# Patient Record
Sex: Female | Born: 1980 | Race: White | Hispanic: No | Marital: Single | Smoking: Never smoker
Health system: Southern US, Community
[De-identification: ages and names within clinical notes are randomized; demographics above are authoritative.]

## PROBLEM LIST (undated history)

## (undated) DIAGNOSIS — I8390 Asymptomatic varicose veins of unspecified lower extremity: Secondary | ICD-10-CM

## (undated) HISTORY — DX: Asymptomatic varicose veins of unspecified lower extremity: I83.90

---

## 2005-01-15 ENCOUNTER — Other Ambulatory Visit: Admission: RE | Admit: 2005-01-15 | Discharge: 2005-01-15 | Payer: Self-pay | Admitting: Obstetrics and Gynecology

## 2010-02-02 ENCOUNTER — Ambulatory Visit (HOSPITAL_COMMUNITY): Admission: RE | Admit: 2010-02-02 | Discharge: 2010-02-02 | Payer: Self-pay | Admitting: Obstetrics and Gynecology

## 2010-06-22 ENCOUNTER — Inpatient Hospital Stay (HOSPITAL_COMMUNITY)
Admission: RE | Admit: 2010-06-22 | Discharge: 2010-06-25 | DRG: 372 | Disposition: A | Discharge status: Home / Self Care | Payer: BC Managed Care – PPO | Source: Ambulatory Visit | Attending: Obstetrics and Gynecology | Admitting: Obstetrics and Gynecology

## 2010-06-22 DIAGNOSIS — O139 Gestational [pregnancy-induced] hypertension without significant proteinuria, unspecified trimester: Principal | ICD-10-CM | POA: Diagnosis present

## 2010-06-22 DIAGNOSIS — Z22330 Carrier of Group B streptococcus: Secondary | ICD-10-CM

## 2010-06-22 DIAGNOSIS — O99892 Other specified diseases and conditions complicating childbirth: Secondary | ICD-10-CM | POA: Diagnosis present

## 2010-06-22 LAB — CBC
HCT: 37.2 % (ref 36.0–46.0)
MCH: 29.2 pg (ref 26.0–34.0)
MCHC: 33.3 g/dL (ref 30.0–36.0)
MCV: 87.5 fL (ref 78.0–100.0)
Platelets: 194 10*3/uL (ref 150–400)
RDW: 13.1 % (ref 11.5–15.5)
WBC: 7.1 10*3/uL (ref 4.0–10.5)

## 2010-06-24 LAB — CBC
MCH: 29 pg (ref 26.0–34.0)
MCHC: 32.8 g/dL (ref 30.0–36.0)
MCV: 88.6 fL (ref 78.0–100.0)
Platelets: 161 10*3/uL (ref 150–400)
RDW: 13.2 % (ref 11.5–15.5)

## 2011-12-20 IMAGING — US US OB DETAIL+14 WK
2 series · 14 of 28 positions shown · non-contrast
Comparison: none

OBSTETRICAL ULTRASOUND:
 This ultrasound exam was performed in the [HOSPITAL] Ultrasound Department.  The OB US report was generated in the AS system, and faxed to the ordering physician.  This report is also available in [HOSPITAL]?s AccessANYware and in [REDACTED] PACS.

[Series 1: us ob detail +14 wk · 0.27mm/px · 12 of 54 slices shown (1 of 2)]
[im 3/54]
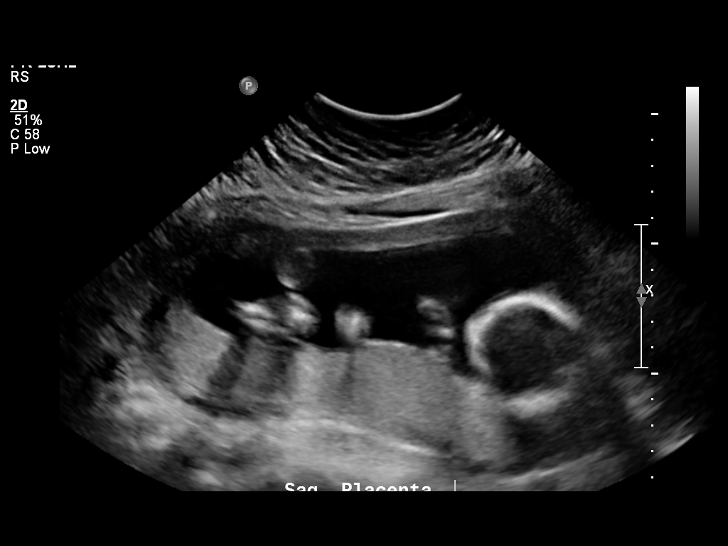
[im 7/54]
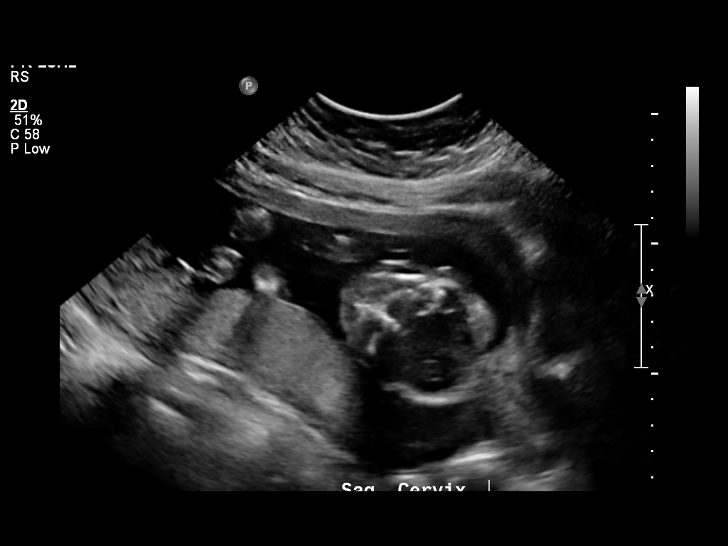
[im 12/54]
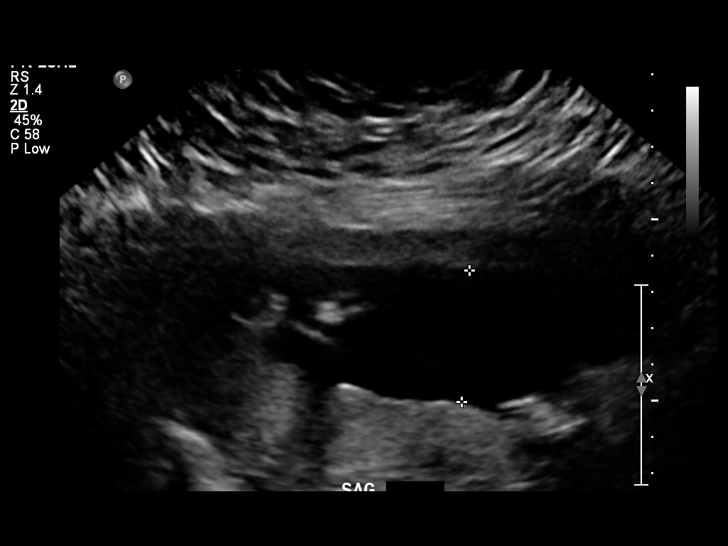
[im 17/54]
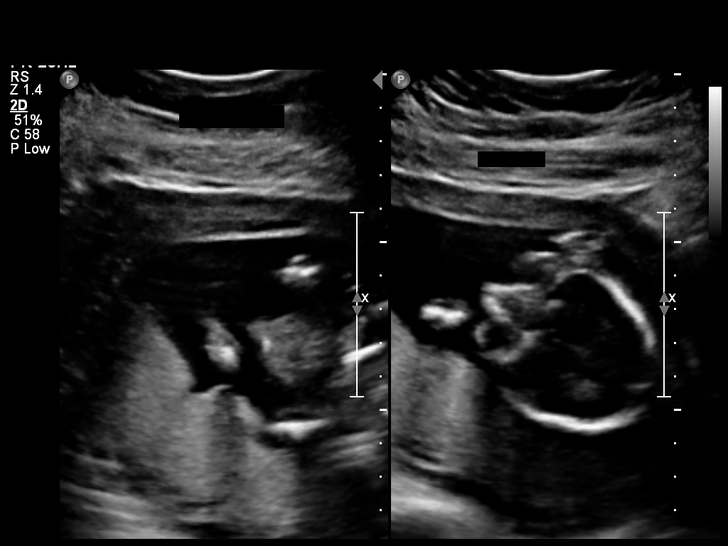
[im 21/54]
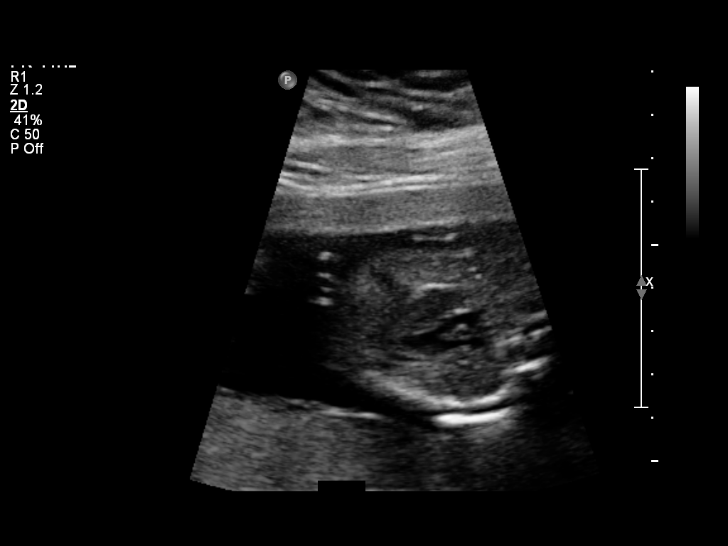
[im 26/54]
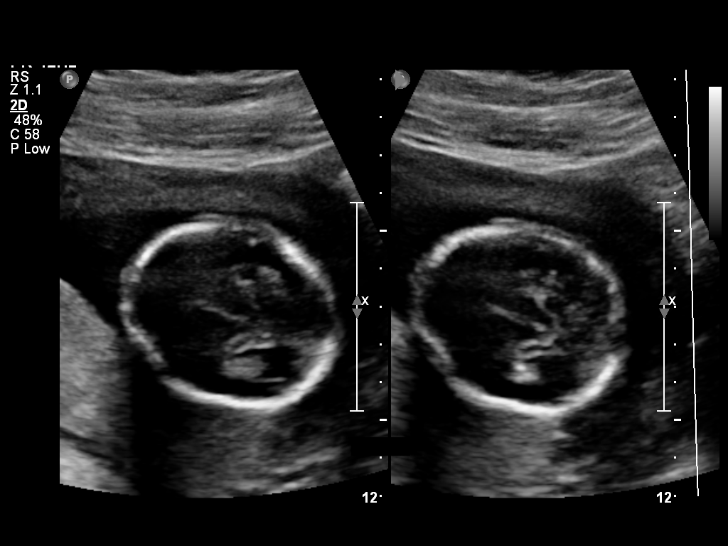
[im 30/54]
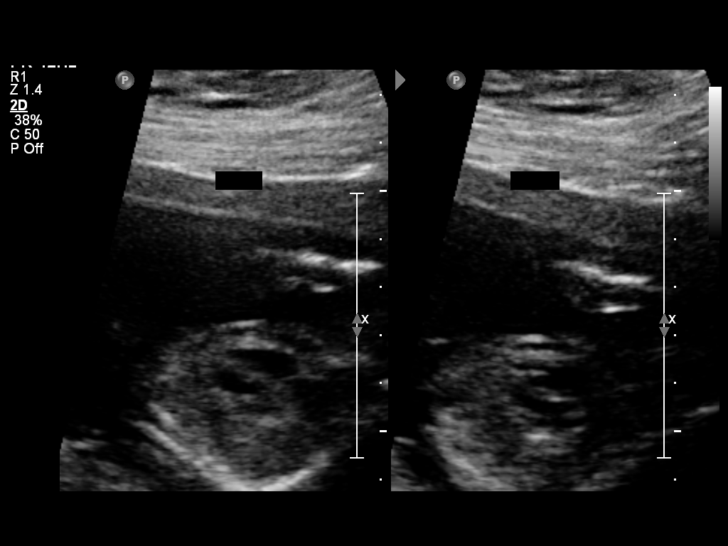
[im 35/54]
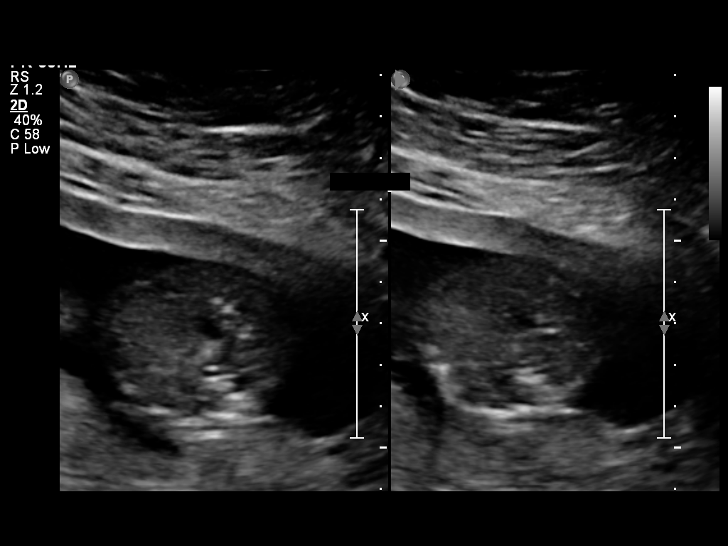
[im 40/54]
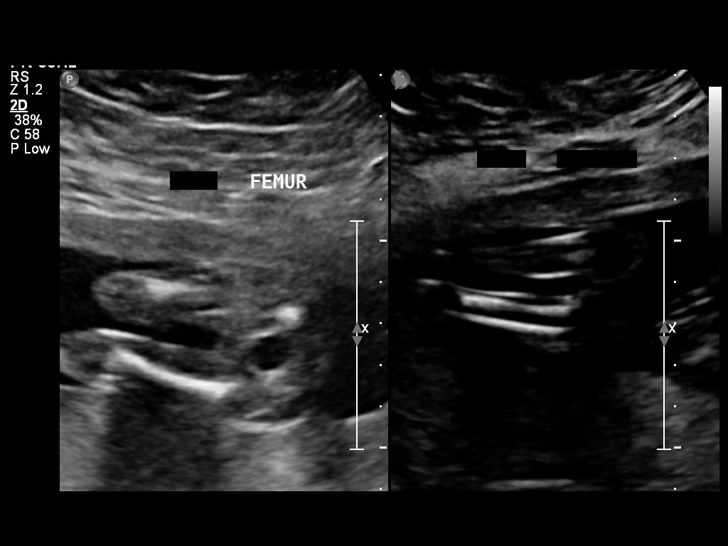
[im 44/54]
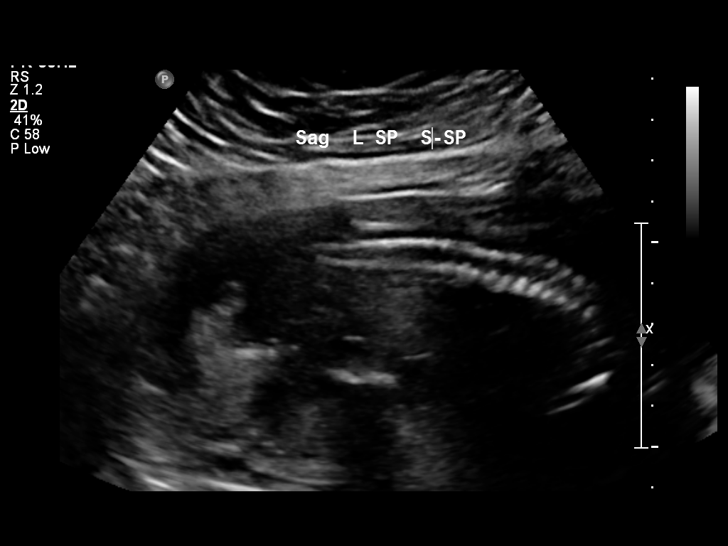
[im 49/54]
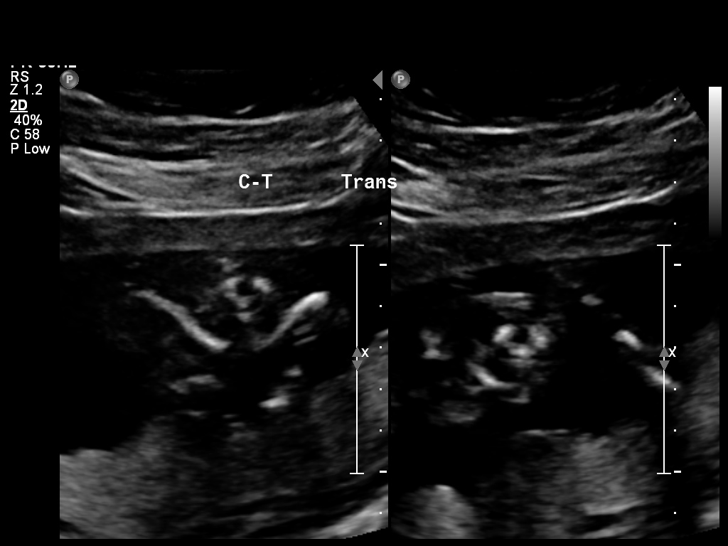
[im 54/54]
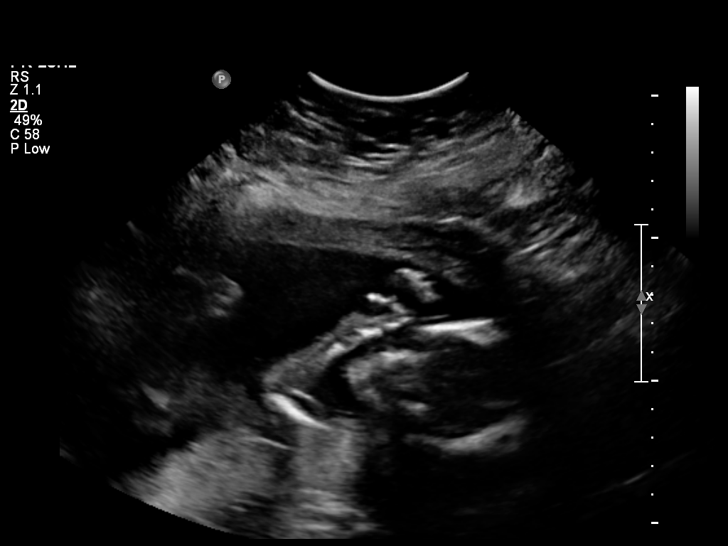

[Series 1: us ob detail +14 wk · 0.21mm/px · 2 of 9 slices shown (2 of 2)]
[im 3/9]
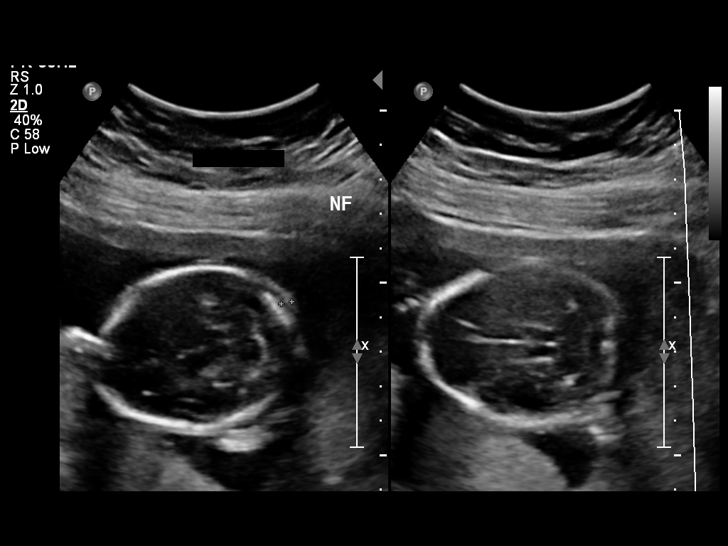
[im 9/9]
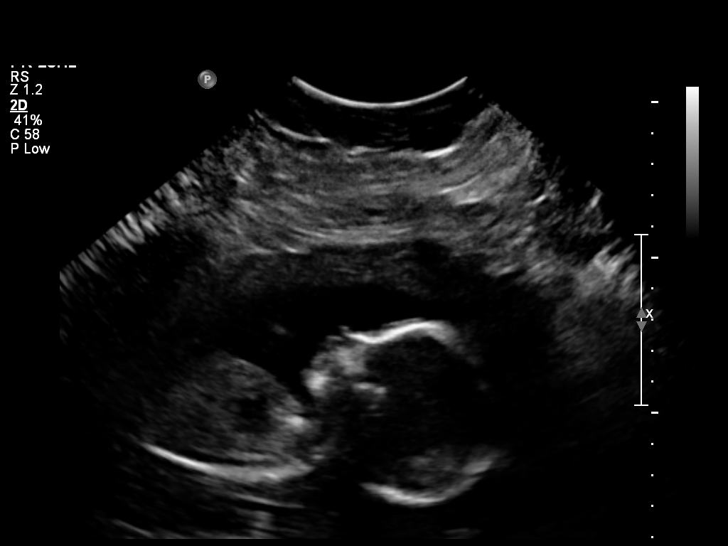

[14 of 28 positions shown; findings below may reference images not displayed]

IMPRESSION: See AS Obstetric US report.

## 2012-11-05 ENCOUNTER — Ambulatory Visit: Payer: Self-pay

## 2012-11-06 ENCOUNTER — Ambulatory Visit: Payer: Self-pay | Admitting: *Deleted

## 2012-11-06 ENCOUNTER — Ambulatory Visit (INDEPENDENT_AMBULATORY_CARE_PROVIDER_SITE_OTHER): Payer: BC Managed Care – PPO | Admitting: *Deleted

## 2012-11-06 DIAGNOSIS — Z23 Encounter for immunization: Secondary | ICD-10-CM

## 2012-12-23 ENCOUNTER — Encounter: Payer: Self-pay | Admitting: Vascular Surgery

## 2012-12-24 ENCOUNTER — Encounter: Payer: BC Managed Care – PPO | Admitting: Vascular Surgery

## 2013-01-20 ENCOUNTER — Other Ambulatory Visit: Payer: Self-pay | Admitting: *Deleted

## 2013-01-20 DIAGNOSIS — I83893 Varicose veins of bilateral lower extremities with other complications: Secondary | ICD-10-CM

## 2013-02-03 ENCOUNTER — Encounter: Payer: Self-pay | Admitting: Vascular Surgery

## 2013-02-04 ENCOUNTER — Encounter: Payer: Self-pay | Admitting: Vascular Surgery

## 2013-02-04 ENCOUNTER — Ambulatory Visit (HOSPITAL_COMMUNITY)
Admission: RE | Admit: 2013-02-04 | Discharge: 2013-02-04 | Disposition: A | Discharge status: Home / Self Care | Payer: BC Managed Care – PPO | Source: Ambulatory Visit | Attending: Vascular Surgery | Admitting: Vascular Surgery

## 2013-02-04 ENCOUNTER — Encounter (INDEPENDENT_AMBULATORY_CARE_PROVIDER_SITE_OTHER): Payer: Self-pay

## 2013-02-04 ENCOUNTER — Ambulatory Visit (INDEPENDENT_AMBULATORY_CARE_PROVIDER_SITE_OTHER): Payer: Self-pay | Admitting: Vascular Surgery

## 2013-02-04 VITALS — BP 128/76 | HR 74 | Ht 72.0 in | Wt 240.0 lb

## 2013-02-04 DIAGNOSIS — I83893 Varicose veins of bilateral lower extremities with other complications: Secondary | ICD-10-CM | POA: Insufficient documentation

## 2013-02-04 NOTE — Progress Notes (Signed)
[facility]  Patient name: Latasha Holmes MRN: [id number] DOB: Sep 01, 1980 Sex: female  REASON FOR CONSULT: varicose veins of both lower extremity  HPI: Latasha Holmes is a 32 y.o. female who began developing increasing varicose veins of both lower extremities after her pregnancy and 2012 to she experiences aching pain, heaviness, fatigue, and cramps in both lower extremities but more significantly on the right side. Her symptoms are aggravated by standing and sitting. Her symptoms are alleviated somewhat with elevation. She has tried nonmedical grade compression stockings without much relief. She is unaware of any history of DVT or phlebitis.  History reviewed. No pertinent past medical history.  Family History  Problem Relation Age of Onset  . Varicose Veins Mother    SOCIAL HISTORY: History  Substance Use Topics  . Smoking status: Never Smoker   . Smokeless tobacco: Never Used  . Alcohol Use: 1.2 oz/week    2 Glasses of wine per week   No Known Allergies No current outpatient prescriptions on file.   No current facility-administered medications for this visit.   REVIEW OF SYSTEMS: Arly.Keller ] denotes positive finding; [  ] denotes negative finding  CARDIOVASCULAR:  [ ]  chest pain   [ ]  chest pressure   [ ]  palpitations   [ ]  orthopnea   [ ]  dyspnea on exertion   [ ]  claudication   [ ]  rest pain   [ ]  DVT   [ ]  phlebitis PULMONARY:   [ ]  productive cough   [ ]  asthma   [ ]  wheezing NEUROLOGIC:   [ ]  weakness  [ ]  paresthesias  [ ]  aphasia  [ ]  amaurosis  [ ]  dizziness HEMATOLOGIC:   [ ]  bleeding problems   [ ]  clotting disorders MUSCULOSKELETAL:  [ ]  joint pain   [ ]  joint swelling [ ]  leg swelling GASTROINTESTINAL: [ ]   blood in stool  [ ]   hematemesis GENITOURINARY:  [ ]   dysuria  [ ]   hematuria PSYCHIATRIC:  [ ]  history of major depression INTEGUMENTARY:  [ ]  rashes  [ ]  ulcers CONSTITUTIONAL:  [ ]  fever   [ ]  chills  PHYSICAL EXAM: Filed  Vitals:   02/04/13 1123  BP: 128/76  Pulse: 74  Height: 6' (1.829 m)  Weight: 240 lb (108.863 kg)  SpO2: 100%   Body mass index is 32.54 kg/(m^2). GENERAL: The patient is a well-nourished female, in no acute distress. The vital signs are documented above. CARDIOVASCULAR: There is a regular rate and rhythm. I do not detect carotid bruits. She has palpable pedal pulses. PULMONARY: There is good air exchange bilaterally without wheezing or rales. ABDOMEN: Soft and non-tender with normal pitched bowel sounds.  MUSCULOSKELETAL: There are no major deformities or cyanosis. NEUROLOGIC: No focal weakness or paresthesias are detected. SKIN: She has some large varicosities on the medial aspect and posterior aspect of her right calf. She has some varicosities along the anterior lateral aspect of her left leg. PSYCHIATRIC: The patient has a normal affect.  DATA:  I have independently interpreted her venous duplex scan. She has no evidence of deep vein reflux on the right or evidence of DVT on the right. She does have significant reflux in her right greater saphenous vein.  On the left side she has some incompetence of the lateral branch of her greater saphenous vein. She also has some deep vein reflux on the left. There is no DVT on the left.  MEDICAL ISSUES:  PAINFUL VARICOSE VEINS OF BOTH LOWER EXTREMITIES:  The patient has painful varicose veins of both lower extremities. Her symptoms are more significant on the right side. She has significant reflux in her right greater saphenous vein. We have discussed the importance of intermittent leg elevation. In addition I have written her a prescription for a thigh high compression stockings with a gradient of 20-30 mm of mercury. 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 I have encouraged her to stay as active as possible. We will have her return in 3 months. If her symptoms are  not better she could be considered for laser ablation of her right greater saphenous vein. Symptoms on the left are not as severe but we can follow this also.   Gad Aymond S [facility] Beeper: [phone]

## 2013-05-04 ENCOUNTER — Encounter: Payer: Self-pay | Admitting: Vascular Surgery

## 2013-05-05 ENCOUNTER — Ambulatory Visit (INDEPENDENT_AMBULATORY_CARE_PROVIDER_SITE_OTHER): Payer: BC Managed Care – PPO | Admitting: Vascular Surgery

## 2013-05-05 ENCOUNTER — Encounter: Payer: Self-pay | Admitting: Vascular Surgery

## 2013-05-05 VITALS — BP 145/81 | HR 89 | Resp 18 | Ht 72.0 in | Wt 239.9 lb

## 2013-05-05 DIAGNOSIS — I83893 Varicose veins of bilateral lower extremities with other complications: Secondary | ICD-10-CM

## 2013-05-05 NOTE — Progress Notes (Signed)
Problems with Activities of Daily Living Secondary to Leg Pain  1. 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 This is difficult due to leg pain.  2. Latasha Holmes states cooking, cleaning, shopping are all difficult for her due to leg pain.   3. Latasha Holmes states exercise is difficult for her due to leg pain.     Failure of  Conservative Therapy:  1. Worn 20-30 mm Hg thigh high compression hose >3 months with no relief of symptoms.  2. Frequently elevates legs-no relief of symptoms  3. Taken Ibuprofen 600 Mg TID with no relief of symptoms.  Patient is a appear for discussion of her bilateral venous hypertension. She has been compliant with her compression garments and continues to have difficulty in both lower extremity. She reports aching sensation with prolonged standing over her legs most from her knees distally into her calves. This makes it difficult for her work and also for her exercise program.  History reviewed. No pertinent past medical history.  History  Substance Use Topics  . Smoking status: Never Smoker   . Smokeless tobacco: Never Used  . Alcohol Use: 1.2 oz/week    2 Glasses of wine per week    Family History  Problem Relation Age of Onset  . Varicose Veins Mother     No Known Allergies  No current outpatient prescriptions on file.  BP 145/81  Pulse 89  Resp 18  Ht 6' (1.829 m)  Wt 239 lb 14.4 oz (108.818 kg)  BMI 32.53 kg/m2  Body mass index is 32.53 kg/(m^2).       On physical exam she does have normal dorsalis pedis pulses bilaterally She does have significant varicosities in her right popliteal fossa and also scattered varicosities over her left anterior thigh and lateral calf. She underwent re\re imaging of her veins with SonoSite by myself. This was compared to her formal venous duplex from 02/04/2013. She does have gross reflux in her great saphenous vein on the right into these varicosities. On the  left she has at refluxing anterior branch arising from the saphenofemoral junction and this extends into the varicosities in her left anterior thigh.  Impression and plan: I feel that she is occluded he failed conservative treatment. I have recommended staged bilateral treatment. On the right I would recommend laser ablation of her right great saphenous vein and stab phlebectomy of multiple tributary varicosities in her popliteal fossa and calf On the right I would recommend laser ablation of her anterior saphenous refluxing vein.  She understands this is a local anesthesia procedure in our office. Also discussed the slight risk of DVT with procedure. She was to proceed as soon as possible.

## 2013-05-06 ENCOUNTER — Other Ambulatory Visit: Payer: Self-pay | Admitting: *Deleted

## 2013-05-06 DIAGNOSIS — I83893 Varicose veins of bilateral lower extremities with other complications: Secondary | ICD-10-CM

## 2013-05-12 ENCOUNTER — Telehealth: Payer: Self-pay | Admitting: Vascular Surgery

## 2013-05-12 NOTE — Telephone Encounter (Signed)
Message copied by Gena Fray on Tue May 12, 2013 12:33 PM ------      Message from: Norberto Sorenson D      Created: Tue May 12, 2013  9:42 AM      Regarding: scheduling       I have had to cancel and reschedule procedures and follow ups for this patient so its a bit confusing....Marland KitchenMarland KitchenLet me know if you have any questions.              On 08-06-2013, I kept her VV FU (already scheduled) with Dr. Donnetta Hutching.  An order is in [facility] for the (post laser )venous duplex and it will need to be scheduled for 9AM on the right leg.  Please link the order for the right leg to the 08-06-2013 appt. already scheduled.              On 08-20-2013, please schedule post  LA duplex (left leg, order in [facility]) and VV FU with Dr. Donnetta Hutching.  (9:00AM lab/ Dr. Donnetta Hutching 10 or 1015A). Again, please link the left leg order for the venous duplex!   Thanks!            I did not cancel the orders and redo.  Talked with Hoyle Sauer and she said they were fine just make sure they are linked correctly.  Thanks! ------

## 2013-07-02 ENCOUNTER — Other Ambulatory Visit: Payer: BC Managed Care – PPO | Admitting: Vascular Surgery

## 2013-07-09 ENCOUNTER — Ambulatory Visit: Payer: BC Managed Care – PPO | Admitting: Vascular Surgery

## 2013-07-09 ENCOUNTER — Encounter (HOSPITAL_COMMUNITY): Payer: BC Managed Care – PPO

## 2013-07-29 ENCOUNTER — Encounter: Payer: Self-pay | Admitting: Vascular Surgery

## 2013-07-30 ENCOUNTER — Ambulatory Visit (INDEPENDENT_AMBULATORY_CARE_PROVIDER_SITE_OTHER): Payer: BC Managed Care – PPO | Admitting: Vascular Surgery

## 2013-07-30 ENCOUNTER — Encounter: Payer: Self-pay | Admitting: Vascular Surgery

## 2013-07-30 VITALS — BP 136/84 | HR 84 | Resp 18 | Ht 72.0 in | Wt 240.0 lb

## 2013-07-30 DIAGNOSIS — I83893 Varicose veins of bilateral lower extremities with other complications: Secondary | ICD-10-CM

## 2013-07-30 HISTORY — PX: ENDOVENOUS ABLATION SAPHENOUS VEIN W/ LASER: SUR449

## 2013-07-30 NOTE — Progress Notes (Signed)
   Laser Ablation Procedure      Date: 07/30/2013    Latasha Holmes DOB:Feb 15, 1981  Consent signed: Yes  Surgeon:T.F. Airanna Partin  Procedure: Laser Ablation: right Greater Saphenous Vein  BP 136/84  Pulse 84  Resp 18  Ht 6' (1.829 m)  Wt 240 lb (108.863 kg)  BMI 32.54 kg/m2  Start time: 8:45AM   End time: 10:00AM  Tumescent Anesthesia: 500 cc 0.9% NaCl with 50 cc Lidocaine HCL with 1% Epi and 15 cc 8.4% NaHCO3  Local Anesthesia: 3 cc Lidocaine HCL and NaHCO3 (ratio 2:1)  Continuous Mode: 15 Watts Total Energy 2836  Joules Total Time 3:09     Stab Phlebectomy: 10-20 incisions Sites: Calf  Right leg  Patient tolerated procedure well: Yes    Description of Procedure:  After marking the course of the saphenous vein and the secondary varicosities in the standing position, the patient was placed on the operating table in the supine position, and the right leg was prepped and draped in sterile fashion. Local anesthetic was administered, and under ultrasound guidance the saphenous vein was accessed with a micro needle and guide wire; then the micro puncture sheath was placed. A guide wire was inserted to the saphenofemoral junction, followed by a 5 french sheath.  The position of the sheath and then the laser fiber below the junction was confirmed using the ultrasound and visualization of the aiming beam.  Tumescent anesthesia was administered along the course of the saphenous vein using ultrasound guidance. Protective laser glasses were placed on the patient, and the laser was fired at at 15 watt continuous mode.  For a total of 2836 joules.  A steri strip was applied to the puncture site.  The patient was then put into Trendelenburg position.  Local anesthetic was utilized overlying the marked varicosities.  Greater than 10-20 stab wounds were made using the tip of an 11 blade; and using the vein hook,  The phlebectomies were performed using a hemostat to avulse these varicosities.   Adequate hemostasis was achieved, and steri strips were applied to the stab wound.      ABD pads and thigh high compression stockings were applied.  Ace wrap bandages were applied over the phlebectomy sites and at the top of the saphenofemoral junction.  Blood loss was less than 15 cc.  The patient ambulated out of the operating room having tolerated the procedure well.

## 2013-07-31 ENCOUNTER — Encounter: Payer: Self-pay | Admitting: Vascular Surgery

## 2013-08-03 ENCOUNTER — Telehealth: Payer: Self-pay | Admitting: *Deleted

## 2013-08-03 NOTE — Telephone Encounter (Signed)
    08/03/2013  Time: 9:02 AM   Patient Name: Latasha Holmes  Patient of: T.F. Early  Procedure:Laser Ablation right greater saphenous vein and stab phlebectomy 10-20 incisions right leg 07-30-2013  Reached patient at home and checked  Her status  Yes    Comments/Actions Taken: Mrs. Omahoney states no bleeding/oozing or swelling.  Reports mild/moderate right inner thigh discomfort relieved by Ibuprofen and ice compresses.  Reports Ace wrap slipped down so she rewrapped the Ace wrap and covered  the top part of her right thigh. Reviewed post procedural instructions with her and reminded her of post laser ablation duplex and follow up appointment with Dr. Donnetta Hutching on 08-06-2013.      @SIGNATURE @

## 2013-08-05 ENCOUNTER — Encounter: Payer: Self-pay | Admitting: Vascular Surgery

## 2013-08-06 ENCOUNTER — Ambulatory Visit (INDEPENDENT_AMBULATORY_CARE_PROVIDER_SITE_OTHER): Payer: BC Managed Care – PPO | Admitting: Vascular Surgery

## 2013-08-06 ENCOUNTER — Encounter: Payer: Self-pay | Admitting: Vascular Surgery

## 2013-08-06 ENCOUNTER — Ambulatory Visit (HOSPITAL_COMMUNITY)
Admission: RE | Admit: 2013-08-06 | Discharge: 2013-08-06 | Disposition: A | Discharge status: Home / Self Care | Payer: BC Managed Care – PPO | Source: Ambulatory Visit | Attending: Vascular Surgery | Admitting: Vascular Surgery

## 2013-08-06 ENCOUNTER — Encounter (HOSPITAL_COMMUNITY): Payer: BC Managed Care – PPO

## 2013-08-06 VITALS — BP 135/90 | HR 73 | Resp 16 | Ht 72.0 in | Wt 240.0 lb

## 2013-08-06 DIAGNOSIS — I83893 Varicose veins of bilateral lower extremities with other complications: Secondary | ICD-10-CM

## 2013-08-06 NOTE — Progress Notes (Signed)
The patient presents today for one week followup of laser ablation of right great saphenous vein and stab phlebectomy of multiple tributary varicosities in her calf. She reports the usual amount of soreness in the ablation site with minimal discomfort in the phlebectomy site  On physical exam the phlebectomy sites are quite good with a very mild bruising. Mild bruising in the thigh from the ablation site as well.  Venous duplex today shows successful ablation of her great saphenous vein from the insertion site at the knee to just below the saphenofemoral junction. There is no evidence of DVT   Impression and plan successful ablation of right great saphenous vein and stab phlebectomy. We'll continue wearing her compression garment for one additional week. We'll see [place] in one week for similar treatment in her left leg.

## 2013-08-12 ENCOUNTER — Encounter: Payer: Self-pay | Admitting: Vascular Surgery

## 2013-08-13 ENCOUNTER — Ambulatory Visit (INDEPENDENT_AMBULATORY_CARE_PROVIDER_SITE_OTHER): Payer: BC Managed Care – PPO | Admitting: Vascular Surgery

## 2013-08-13 ENCOUNTER — Encounter: Payer: Self-pay | Admitting: Vascular Surgery

## 2013-08-13 VITALS — BP 118/82 | HR 76 | Resp 18 | Ht 72.0 in | Wt 240.0 lb

## 2013-08-13 DIAGNOSIS — I83893 Varicose veins of bilateral lower extremities with other complications: Secondary | ICD-10-CM

## 2013-08-13 HISTORY — PX: ENDOVENOUS ABLATION SAPHENOUS VEIN W/ LASER: SUR449

## 2013-08-13 NOTE — Progress Notes (Signed)
   Laser Ablation Procedure      Date: 08/13/2013    Latasha Holmes DOB:27-Sep-1980  Consent signed: Yes  Surgeon:T.F. Zawadi Aplin  Procedure: Laser Ablation: left Greater Saphenous Vein  Anterior branch  BP 118/82  Pulse 76  Resp 18  Ht 6' (1.829 m)  Wt 240 lb (108.863 kg)  BMI 32.54 kg/m2  Start time: 0845   End time: 0955  Tumescent Anesthesia: 350 cc 0.9% NaCl with 50 cc Lidocaine HCL with 1% Epi and 15 cc 8.4% NaHCO3  Local Anesthesia: 2 cc Lidocaine HCL and NaHCO3 (ratio 2:1)  Continuous Mode: 15 Watts Total Energy 1011 JOULES  Total Time1:07     Stab Phlebectomy: <10 INCISIONS Sites: Thigh and Calf  LEFT LEG  Patient tolerated procedure well: Yes    Description of Procedure:  After marking the course of the saphenous vein and the secondary varicosities in the standing position, the patient was placed on the operating table in the supine position, and the left leg was prepped and draped in sterile fashion. Local anesthetic was administered, and under ultrasound guidance the saphenous vein was accessed with a micro needle and guide wire; then the micro puncture sheath was placed. A guide wire was inserted to the saphenofemoral junction, followed by a 5 french sheath.  The position of the sheath and then the laser fiber below the junction was confirmed using the ultrasound and visualization of the aiming beam.  Tumescent anesthesia was administered along the course of the saphenous vein using ultrasound guidance. Protective laser glasses were placed on the patient, and the laser was fired at at 15 watt continuous mode.  For a total of 1011 joules.  A steri strip was applied to the puncture site.  The patient was then put into Trendelenburg position.  Local anesthetic was utilized overlying the marked varicosities.  Greater than <10 stab wounds were made using the tip of an 11 blade; and using the vein hook,  The phlebectomies were performed using a hemostat to avulse these  varicosities.  Adequate hemostasis was achieved, and steri strips were applied to the stab wound.      ABD pads and thigh high compression stockings were applied.  Ace wrap bandages were applied over the phlebectomy sites and at the top of the saphenofemoral junction.  Blood loss was less than 15 cc.  The patient ambulated out of the operating room having tolerated the procedure well.

## 2013-08-14 ENCOUNTER — Telehealth: Payer: Self-pay | Admitting: *Deleted

## 2013-08-14 NOTE — Telephone Encounter (Signed)
    08/14/2013  Time: 3:26 PM   Patient Name: Latasha Holmes  Patient of: T.F. Early  Procedure:Laser Ablation left  Anterior branch of greater saphenous vein and stab phlebectomy <10 incisions left leg 08-13-2013    Reached patient at home and checked  Her status  Yes    Comments/Actions Taken: Mrs. Boening reports no c/o of left leg pain, swelling, or bleeding/oozing over phlebectomy sites.  Reviewed post procedural instructions with her and reminded her of post LA duplex and VV FU with Dr. Donnetta Hutching on 08-20-2013.      @SIGNATURE @

## 2013-08-19 ENCOUNTER — Encounter: Payer: Self-pay | Admitting: Vascular Surgery

## 2013-08-20 ENCOUNTER — Ambulatory Visit (HOSPITAL_COMMUNITY)
Admission: RE | Admit: 2013-08-20 | Discharge: 2013-08-20 | Disposition: A | Discharge status: Home / Self Care | Payer: BC Managed Care – PPO | Source: Ambulatory Visit | Attending: Vascular Surgery | Admitting: Vascular Surgery

## 2013-08-20 ENCOUNTER — Encounter: Payer: Self-pay | Admitting: Vascular Surgery

## 2013-08-20 ENCOUNTER — Ambulatory Visit (INDEPENDENT_AMBULATORY_CARE_PROVIDER_SITE_OTHER): Payer: Self-pay | Admitting: Vascular Surgery

## 2013-08-20 VITALS — BP 104/72 | HR 73 | Resp 16 | Ht 72.0 in | Wt 230.0 lb

## 2013-08-20 DIAGNOSIS — I83893 Varicose veins of bilateral lower extremities with other complications: Secondary | ICD-10-CM | POA: Insufficient documentation

## 2013-08-20 NOTE — Progress Notes (Signed)
Here today for followup of her staged bilateral laser ablation of her great saphenous vein and stab phlebectomy. She's done quite well. She reports mild discomfort related to the ablation and minimal discomfort from her for colectomy. She on physical exam has mild bruising and excellent result from the phlebectomy of her tributary varicosities over her anterior thigh and lateral calf.  Venous duplex today reveals closure of her great saphenous vein from the distal insertion site to centimeter from her saphenofemoral junction. No evidence of DVT  Impression and plan successful ablation of her great saphenous vein bilaterally with no DVT. She will continue her stocking use for one additional week and will see [place] again on an as-needed basis

## 2013-08-28 ENCOUNTER — Telehealth: Payer: Self-pay | Admitting: Vascular Surgery

## 2013-08-28 NOTE — Telephone Encounter (Signed)
Patient called to request a copy of her receipt and charge sheet from procedure on 07/30/13. She needs this information to turn in with her FSA.  Verified correct address, date of birth and DOS. Mailed copies to home address.  dpm

## 2015-06-27 ENCOUNTER — Encounter: Payer: Self-pay | Admitting: *Deleted

## 2015-06-29 ENCOUNTER — Ambulatory Visit (INDEPENDENT_AMBULATORY_CARE_PROVIDER_SITE_OTHER): Payer: 59 | Admitting: *Deleted

## 2015-06-29 DIAGNOSIS — I781 Nevus, non-neoplastic: Secondary | ICD-10-CM | POA: Insufficient documentation

## 2015-06-29 NOTE — Progress Notes (Signed)
   Cutaneous Laser:pulsed mode  810j/cm2 400 ms delay  13 ms Duration 0.5 spot  Total pulses: 632 Total energy 999  Total time::08  Photos: No.  Compression stockings applied: No.NA  Pt going to [place] so we opted for CL. Good response. Will see if it works. If not, sclero will be the next option. Tol well. Follow prn.

## 2015-06-30 ENCOUNTER — Encounter: Payer: Self-pay | Admitting: Vascular Surgery

## 2015-08-28 ENCOUNTER — Telehealth: Payer: 59 | Admitting: Family

## 2015-08-28 DIAGNOSIS — B9689 Other specified bacterial agents as the cause of diseases classified elsewhere: Secondary | ICD-10-CM

## 2015-08-28 DIAGNOSIS — J329 Chronic sinusitis, unspecified: Secondary | ICD-10-CM

## 2015-08-28 DIAGNOSIS — A499 Bacterial infection, unspecified: Secondary | ICD-10-CM

## 2015-08-28 MED ORDER — AMOXICILLIN-POT CLAVULANATE 875-125 MG PO TABS: 1.0000 | ORAL_TABLET | Freq: Two times a day (BID) | ORAL | Status: DC

## 2015-08-28 NOTE — Progress Notes (Signed)

## 2016-02-05 ENCOUNTER — Telehealth: Payer: 59 | Admitting: Family

## 2016-02-05 DIAGNOSIS — B9689 Other specified bacterial agents as the cause of diseases classified elsewhere: Secondary | ICD-10-CM

## 2016-02-05 DIAGNOSIS — J329 Chronic sinusitis, unspecified: Secondary | ICD-10-CM

## 2016-02-05 MED ORDER — AMOXICILLIN-POT CLAVULANATE 875-125 MG PO TABS: 1.0000 | ORAL_TABLET | Freq: Two times a day (BID) | ORAL | 0 refills | Status: AC

## 2016-02-05 NOTE — Progress Notes (Signed)

## 2016-07-18 ENCOUNTER — Telehealth: Payer: 59 | Admitting: Nurse Practitioner

## 2016-07-18 DIAGNOSIS — R05 Cough: Secondary | ICD-10-CM

## 2016-07-18 DIAGNOSIS — J0101 Acute recurrent maxillary sinusitis: Secondary | ICD-10-CM

## 2016-07-18 DIAGNOSIS — R059 Cough, unspecified: Secondary | ICD-10-CM

## 2016-07-18 MED ORDER — AZITHROMYCIN 250 MG PO TABS: ORAL_TABLET | ORAL | 0 refills | Status: DC

## 2016-07-18 NOTE — Progress Notes (Signed)

## 2016-09-09 ENCOUNTER — Telehealth: Payer: 59 | Admitting: Family

## 2016-09-09 DIAGNOSIS — R05 Cough: Secondary | ICD-10-CM

## 2016-09-09 DIAGNOSIS — J069 Acute upper respiratory infection, unspecified: Secondary | ICD-10-CM

## 2016-09-09 DIAGNOSIS — R059 Cough, unspecified: Secondary | ICD-10-CM

## 2016-09-09 MED ORDER — BENZONATATE 100 MG PO CAPS: 100.0000 mg | ORAL_CAPSULE | Freq: Three times a day (TID) | ORAL | 0 refills | Status: DC | PRN

## 2016-09-09 NOTE — Progress Notes (Signed)
We are sorry that you are not feeling well.  Here is how we plan to help!  Based on what you have shared with me it looks like you have upper respiratory tract inflammation that has resulted in a significant cough.  Inflammation and infection in the upper respiratory tract is commonly called bronchitis and has four common causes:  Allergies, Viral Infections, Acid Reflux and Bacterial Infections.  Allergies, viruses and acid reflux are treated by controlling symptoms or eliminating the cause. An example might be a cough caused by taking certain blood pressure medications. You stop the cough by changing the medication. Another example might be a cough caused by acid reflux. Controlling the reflux helps control the cough.  Based on your presentation I believe you most likely have A cough due to a virus.  This is called viral bronchitis and is best treated by rest, plenty of fluids and control of the cough.  You may use Ibuprofen or Tylenol as directed to help your symptoms.     In addition you may use A non-prescription cough medication called Robitussin DAC. Take 2 teaspoons every 8 hours or Delsym: take 2 teaspoons every 12 hours., A non-prescription cough medication called Mucinex DM: take 2 tablets every 12 hours. and A prescription cough medication called Tessalon Perles 100mg . You may take 1-2 capsules every 8 hours as needed for your cough.   Providers prescribe antibiotics to treat infections caused by bacteria. Antibiotics are very powerful in treating bacterial infections when they are used properly. To maintain their effectiveness, they should be used only when necessary. Overuse of antibiotics has resulted in the development of superbugs that are resistant to treatment!    After careful review of your answers, I would not recommend an antibiotic for your condition.  Antibiotics are not effective against viruses and therefore should not be used to treat them. Common examples of infections caused  by viruses include colds and flu   HOME CARE . Only take medications as instructed by your medical team. . Complete the entire course of an antibiotic. . Drink plenty of fluids and get plenty of rest. . Avoid close contacts especially the very young and the elderly . Cover your mouth if you cough or cough into your sleeve. . Always remember to wash your hands . A steam or ultrasonic humidifier can help congestion.   GET HELP RIGHT AWAY IF: . You develop worsening fever. . You become short of breath . You cough up blood. . Your symptoms persist after you have completed your treatment plan MAKE SURE YOU   Understand these instructions.  Will watch your condition.  Will get help right away if you are not doing well or get worse.  Your e-visit answers were reviewed by a board certified advanced clinical practitioner to complete your personal care plan.  Depending on the condition, your plan could have included both over the counter or prescription medications. If there is a problem please reply  once you have received a response from your provider. Your safety is important to [place].  If you have drug allergies check your prescription carefully.    You can use MyChart to ask questions about today's visit, request a non-urgent call back, or ask for a work or school excuse for 24 hours related to this e-Visit. If it has been greater than 24 hours you will need to follow up with your provider, or enter a new e-Visit to address those concerns. You will get an e-mail in  the next two days asking about your experience.  I hope that your e-visit has been valuable and will speed your recovery. Thank you for using e-visits.

## 2017-04-23 ENCOUNTER — Telehealth: Payer: 59 | Admitting: Family

## 2017-04-23 DIAGNOSIS — B9789 Other viral agents as the cause of diseases classified elsewhere: Secondary | ICD-10-CM

## 2017-04-23 DIAGNOSIS — J019 Acute sinusitis, unspecified: Secondary | ICD-10-CM

## 2017-04-23 MED ORDER — PREDNISONE 10 MG (21) PO TBPK: ORAL_TABLET | ORAL | 0 refills | Status: DC

## 2017-04-23 MED ORDER — AMOXICILLIN-POT CLAVULANATE 875-125 MG PO TABS: 1.0000 | ORAL_TABLET | Freq: Two times a day (BID) | ORAL | 0 refills | Status: DC

## 2017-04-23 NOTE — Progress Notes (Signed)
We are sorry that you are not feeling well.  Here is how we plan to help!  Based on what you have shared with me it looks like you have sinusitis.  Sinusitis is inflammation and infection in the sinus cavities of the head.  Based on your presentation I believe you most likely have Acute Viral Sinusitis.This is an infection most likely caused by a virus. There is not specific treatment for viral sinusitis other than to help you with the symptoms until the infection runs its course.  You may use an oral decongestant such as Mucinex D or if you have glaucoma or high blood pressure use plain Mucinex. Saline nasal spray help and can safely be used as often as needed for congestion, I have prescribed: Sterapred dosepak as directed  Some authorities believe that zinc sprays or the use of Echinacea may shorten the course of your symptoms.  Sinus infections are not as easily transmitted as other respiratory infection, however we still recommend that you avoid close contact with loved ones, especially the very young and elderly.  Remember to wash your hands thoroughly throughout the day as this is the number one way to prevent the spread of infection!  Home Care:  Only take medications as instructed by your medical team.  Complete the entire course of an antibiotic.  Do not take these medications with alcohol.  A steam or ultrasonic humidifier can help congestion.  You can place a towel over your head and breathe in the steam from hot water coming from a faucet.  Avoid close contacts especially the very young and the elderly.  Cover your mouth when you cough or sneeze.  Always remember to wash your hands.  Get Help Right Away If:  You develop worsening fever or sinus pain.  You develop a severe head ache or visual changes.  Your symptoms persist after you have completed your treatment plan.  Make sure you  Understand these instructions.  Will watch your condition.  Will get help right  away if you are not doing well or get worse.  Your e-visit answers were reviewed by a board certified advanced clinical practitioner to complete your personal care plan.  Depending on the condition, your plan could have included both over the counter or prescription medications.  If there is a problem please reply  once you have received a response from your provider.  Your safety is important to [place].  If you have drug allergies check your prescription carefully.    You can use MyChart to ask questions about today's visit, request a non-urgent call back, or ask for a work or school excuse for 24 hours related to this e-Visit. If it has been greater than 24 hours you will need to follow up with your provider, or enter a new e-Visit to address those concerns.  You will get an e-mail in the next two days asking about your experience.  I hope that your e-visit has been valuable and will speed your recovery. Thank you for using e-visits.

## 2017-04-23 NOTE — Addendum Note (Signed)
Addended by: Dutch Quint B on: 04/23/2017 03:59 PM   Modules accepted: Orders

## 2018-03-25 ENCOUNTER — Telehealth: Payer: 59 | Admitting: Physician Assistant

## 2018-03-25 DIAGNOSIS — J069 Acute upper respiratory infection, unspecified: Secondary | ICD-10-CM

## 2018-03-25 DIAGNOSIS — B9789 Other viral agents as the cause of diseases classified elsewhere: Secondary | ICD-10-CM

## 2018-03-25 MED ORDER — NAPROXEN 500 MG PO TABS: 500.0000 mg | ORAL_TABLET | Freq: Two times a day (BID) | ORAL | 0 refills | Status: DC

## 2018-03-25 MED ORDER — BENZONATATE 100 MG PO CAPS: 100.0000 mg | ORAL_CAPSULE | Freq: Three times a day (TID) | ORAL | 0 refills | Status: DC

## 2018-03-25 MED ORDER — CETIRIZINE-PSEUDOEPHEDRINE ER 5-120 MG PO TB12: 1.0000 | ORAL_TABLET | Freq: Two times a day (BID) | ORAL | 0 refills | Status: AC

## 2018-03-25 MED ORDER — IPRATROPIUM BROMIDE 0.06 % NA SOLN: 2.0000 | Freq: Four times a day (QID) | NASAL | 0 refills | Status: DC

## 2018-03-25 NOTE — Progress Notes (Signed)
We are sorry you are not feeling well.Here is how we plan to help!  Based on what you have shared with me, it looks like you may have a viral upper respiratory infection or a "common cold".Colds are caused by a large number of viruses; however, rhinovirus is the most common cause.   Symptoms of the common cold vary from person to person, with common symptoms including sore throat, cough, and malaise.A low-grade fever of 100.4 may present, but is often uncommon.Symptoms vary however, and are closely related to a person's age or underlying illnesses.The most common symptoms associated with the common cold are nasal discharge or congestion, cough, sneezing, headache and pressure in the ears and face.Cold symptoms usually persist for about 3 to 10 days, but can last up to 2 weeks.It is important to know that colds do not cause serious illness or complications in most cases.  The common cold is transmitted from person to person, with the most common method of transmission being a person's hands.The virus is able to live on the skin and can infect other persons for up to 2 hours after direct contact.Also, colds are transmitted when someone coughs or sneezes; thus, it is important to cover the mouth to reduce this risk.To keep the spread of the common cold at [place], good hand hygiene is very important.  This is an infection that is most likely caused by a virus. There are no specific treatments for the common cold other than to help you with the symptoms until the infection runs its course.  For nasal congestion, you may use an oral decongestants such as Mucinex D or if you have glaucoma or high blood pressure use plain Mucinex.Saline nasal spray or nasal drops can help and can safely be used as often as needed for congestion.For your congestion, I have prescribed Ipratropium Bromide nasal spray 0.03% two sprays in each nostril 2-3 times a day  If you do not have a history of heart  disease, hypertension, diabetes or thyroid disease, prostate/bladder issues or glaucoma, you may also use Sudafed to treat nasal congestion.It is highly recommended that you consult with a pharmacist or your primary care physician to ensure this medication is safe for you to take.   If you have a cough, you may use cough suppressants such as Delsym and Robitussin.If you have glaucoma or high blood pressure, you can also use Coricidin HBP. For cough I have prescribed for you A prescription cough medication called Tessalon Perles 100 mg. You may take 1-2 capsules every 8 hours as needed for cough  Additionally, I am prescribing an NSAID to help relieve your pain along with a nasal decongestant.  If you have a sore or scratchy throat, use a saltwater gargle-  to  teaspoon of salt dissolved in a 4-ounce to 8-ounce glass of warm water.Gargle the solution for approximately 15-30 seconds and then spit.It is important not to swallow the solution.You can also use throat lozenges/cough drops and Chloraseptic spray to help with throat pain or discomfort.Warm or cold liquids can also be helpful in relieving throat pain.  For headache, pain or general discomfort, you can use Ibuprofen or Tylenol as directed. Some authorities believe that zinc sprays or the use of Echinacea may shorten the course of your symptoms.   HOME CARE Only take medications as instructed by your medical team. Be sure to drink plenty of fluids. Water is fine as well as fruit juices, sodas and electrolyte beverages. You may want to stay away  from caffeine or alcohol. If you are nauseated, try taking small sips of liquids. How do you know if you are getting enough fluid? Your urine should be a pale yellow or almost colorless. Get rest. Taking a steamy shower or using a humidifier may help nasal congestion and ease sore throat pain. You can place a towel over your head and breathe in the steam from hot water coming from a  faucet. Using a saline nasal spray works much the same way. Cough drops, hard candies and sore throat lozenges may ease your cough. Avoid close contacts especially the very young and the elderly Cover your mouth if you cough or sneeze Always remember to wash your hands.   GET HELP RIGHT AWAY IF: You develop worsening fever. If your symptoms do not improve within 10 days You develop yellow or green discharge from your nose over 3 days. You have coughing fits You develop a severe head ache or visual changes. You develop shortness of breath, difficulty breathing or start having chest pain  Your symptoms persist after you have completed your treatment plan  MAKE SURE YOU  Understand these instructions. Will watch your condition. Will get help right away if you are not doing well or get worse.  Your e-visit answers were reviewed by a board certified advanced clinical practitioner to complete your personal care plan. Depending upon the condition, your plan could have included both over the counter or prescription medications. Please review your pharmacy choice. If there is a problem, you may call our nursing hot line at and have the prescription routed to another pharmacy. Your safety is important to [place]. If you have drug allergies check your prescription carefully.   You can use MyChart to ask questions about today's visit, request a non-urgent call back, or ask for a work or school excuse for 24 hours related to this e-Visit. If it has been greater than 24 hours you will need to follow up with your provider, or enter a new e-Visit to address those concerns. You will get an e-mail in the next two days asking about your experience.I hope that your e-visit has been valuable and will speed your recovery. Thank you for using e-visits.

## 2018-03-26 MED ORDER — AMOXICILLIN-POT CLAVULANATE 875-125 MG PO TABS: 1.0000 | ORAL_TABLET | Freq: Two times a day (BID) | ORAL | 0 refills | Status: DC

## 2018-03-26 NOTE — Addendum Note (Signed)
Addended by: Chevis Pretty on: 03/26/2018 01:49 PM   Modules accepted: Orders

## 2018-03-26 NOTE — Progress Notes (Signed)
augmentin was sen tin because patient was getting worse instead of better.  Meds ordered this encounter  Medications  . amoxicillin-clavulanate (AUGMENTIN) 875-125 MG tablet    Sig: Take 1 tablet by mouth 2 (two) times daily.    Dispense:  14 tablet    Refill:  0    Order Specific Question:   Supervising Provider    Answer:   Noemi Chapel [[id number]]

## 2018-06-02 ENCOUNTER — Telehealth: Payer: 59 | Admitting: Family

## 2018-06-02 DIAGNOSIS — J069 Acute upper respiratory infection, unspecified: Secondary | ICD-10-CM

## 2018-06-02 MED ORDER — AMOXICILLIN-POT CLAVULANATE 875-125 MG PO TABS: 1.0000 | ORAL_TABLET | Freq: Two times a day (BID) | ORAL | 0 refills | Status: DC

## 2018-06-02 MED ORDER — BENZONATATE 100 MG PO CAPS: 100.0000 mg | ORAL_CAPSULE | Freq: Three times a day (TID) | ORAL | 0 refills | Status: DC | PRN

## 2018-06-02 MED ORDER — FLUTICASONE PROPIONATE 50 MCG/ACT NA SUSP: 2.0000 | Freq: Every day | NASAL | 6 refills | Status: DC

## 2018-06-02 NOTE — Progress Notes (Signed)

## 2018-06-02 NOTE — Addendum Note (Signed)
Addended by: Dutch Quint B on: 06/02/2018 03:20 PM   Modules accepted: Orders

## 2018-08-09 NOTE — Progress Notes (Signed)
Greater than 5 minutes, yet less than 10 minutes of time have been spent researching, coordinating, and implementing care for this patient today.  Thank you for the details you included in the comment boxes. Those details are very helpful in determining the best course of treatment for you and help us to provide the best care.  

## 2019-09-06 ENCOUNTER — Telehealth: Payer: 59 | Admitting: Family

## 2019-09-06 DIAGNOSIS — J019 Acute sinusitis, unspecified: Secondary | ICD-10-CM

## 2019-09-06 MED ORDER — AMOXICILLIN-POT CLAVULANATE 875-125 MG PO TABS: 1.0000 | ORAL_TABLET | Freq: Two times a day (BID) | ORAL | 0 refills | Status: DC

## 2019-09-06 NOTE — Progress Notes (Signed)

## 2019-10-07 ENCOUNTER — Encounter: Payer: Self-pay | Admitting: *Deleted

## 2019-10-08 ENCOUNTER — Encounter: Payer: Self-pay | Admitting: Physician Assistant

## 2019-10-08 ENCOUNTER — Ambulatory Visit: Payer: BC Managed Care – PPO | Admitting: Physician Assistant

## 2019-10-08 ENCOUNTER — Other Ambulatory Visit: Payer: Self-pay

## 2019-10-08 DIAGNOSIS — Z1283 Encounter for screening for malignant neoplasm of skin: Secondary | ICD-10-CM | POA: Diagnosis not present

## 2019-10-08 DIAGNOSIS — D485 Neoplasm of uncertain behavior of skin: Secondary | ICD-10-CM

## 2019-10-08 DIAGNOSIS — L820 Inflamed seborrheic keratosis: Secondary | ICD-10-CM

## 2019-10-08 DIAGNOSIS — D229 Melanocytic nevi, unspecified: Secondary | ICD-10-CM

## 2019-10-08 DIAGNOSIS — D225 Melanocytic nevi of trunk: Secondary | ICD-10-CM | POA: Diagnosis not present

## 2019-10-08 DIAGNOSIS — D2271 Melanocytic nevi of right lower limb, including hip: Secondary | ICD-10-CM | POA: Diagnosis not present

## 2019-10-08 HISTORY — DX: Melanocytic nevi, unspecified: D22.9

## 2019-10-08 MED ORDER — BIMATOPROST 0.03 % EX SOLN: 1.0000 "application " | Freq: Every day | CUTANEOUS | 3 refills | Status: DC

## 2019-10-08 NOTE — Patient Instructions (Signed)

## 2019-10-08 NOTE — Progress Notes (Signed)
   Follow up Visit  Subjective  Latasha Holmes is a 39 y.o. female who presents for the following: Annual Exam (right outer foot- Looks diferent, right temple and stomach- new spots). Mole on right outer foot has changed border. New mole on abdomen for past 1-2 months. Raised mole on right temple seems new. Patient also is interested in latisse and retinol/retin a creams for face.    Objective  Well appearing patient in no apparent distress; mood and affect are within normal limits.  A full examination was performed including head, eyes, ears, nose, lips, neck, chest, axillae, abdomen, back, buttocks, bilateral upper extremities, bilateral lower extremities, hands, feet, fingers, toes, fingernails, and toenails. All findings within normal limits unless otherwise noted below.   Objective  Right Foot - Anterior: Brown macule with irregular color and border     Objective  Right Lower Back: Brown macule with irregular color       Objective  Right Abdomen (side) - Lower: Total body skin exam  Objective  Left Abdomen (side) - Upper (3), Right Abdomen (side) - Upper: Erythematous stuck-on, waxy papule or plaque.   Assessment & Plan  Neoplasm of uncertain behavior of skin (2) Right Foot - Anterior  Skin / nail biopsy Type of biopsy: tangential   Informed consent: discussed and consent obtained   Timeout: patient name, date of birth, surgical site, and procedure verified   Procedure prep:  Patient was prepped and draped in usual sterile fashion (Non sterile) Prep type:  Chlorhexidine Anesthesia: the lesion was anesthetized in a standard fashion   Anesthetic:  1% lidocaine w/ epinephrine 1-100,000 local infiltration Instrument used: flexible razor blade   Outcome: patient tolerated procedure well   Post-procedure details: wound care instructions given    Specimen 1 - Surgical pathology Differential Diagnosis: r/o atypia Check Margins: No  Right Lower Back  Skin /  nail biopsy Type of biopsy: tangential   Informed consent: discussed and consent obtained   Timeout: patient name, date of birth, surgical site, and procedure verified   Procedure prep:  Patient was prepped and draped in usual sterile fashion (Non sterile) Prep type:  Chlorhexidine Anesthesia: the lesion was anesthetized in a standard fashion   Anesthetic:  1% lidocaine w/ epinephrine 1-100,000 local infiltration Instrument used: flexible razor blade   Outcome: patient tolerated procedure well   Post-procedure details: wound care instructions given    Specimen 2 - Surgical pathology Differential Diagnosis: r/o atypia Check Margins: No  Skin exam for malignant neoplasm Right Abdomen (side) - Lower  Inflamed seborrheic keratosis (4) Left Abdomen (side) - Upper (3); Right Abdomen (side) - Upper  Destruction of lesion - Left Abdomen (side) - Upper, Right Abdomen (side) - Upper Complexity: simple   Destruction method: cryotherapy   Informed consent: discussed and consent obtained   Timeout:  patient name, date of birth, surgical site, and procedure verified Lesion destroyed using liquid nitrogen: Yes   Outcome: patient tolerated procedure well with no complications

## 2019-10-15 ENCOUNTER — Telehealth: Payer: Self-pay

## 2019-10-15 NOTE — Telephone Encounter (Signed)
Phone call to patient with her pathology results. Patient aware of results.  

## 2019-10-15 NOTE — Telephone Encounter (Signed)
-----   Message from Arlyss Gandy, [place] sent at 10/12/2019  4:48 PM EDT ----- Wider shave foot

## 2019-10-29 ENCOUNTER — Other Ambulatory Visit: Payer: Self-pay

## 2019-10-29 ENCOUNTER — Encounter: Payer: Self-pay | Admitting: Physician Assistant

## 2019-10-29 ENCOUNTER — Ambulatory Visit (INDEPENDENT_AMBULATORY_CARE_PROVIDER_SITE_OTHER): Payer: BC Managed Care – PPO | Admitting: Physician Assistant

## 2019-10-29 DIAGNOSIS — D2271 Melanocytic nevi of right lower limb, including hip: Secondary | ICD-10-CM | POA: Diagnosis not present

## 2019-10-29 DIAGNOSIS — D229 Melanocytic nevi, unspecified: Secondary | ICD-10-CM

## 2019-10-29 DIAGNOSIS — D485 Neoplasm of uncertain behavior of skin: Secondary | ICD-10-CM | POA: Diagnosis not present

## 2019-10-29 NOTE — Progress Notes (Signed)
   Follow up Visit  Subjective  Latasha Holmes is a 39 y.o. female who presents for the following: Procedure (WIDER SHAVE on right foot.). Moderate-severe atypia right foot. She forgot to ask about a dusky patch on her right thigh that looks like a bruise but has been there for months.  Objective  Well appearing patient in no apparent distress; mood and affect are within normal limits.  A focused examination was performed including right leg and foot. Relevant physical exam findings are noted in the Assessment and Plan.   Objective  Right Thigh - Anterior: Dusky patch right anterior thigh. Asymptomatic.     Objective  Right Foot - Anterior: Moderate-severe. Still has a thick scab.   Assessment & Plan  Neoplasm of uncertain behavior of skin Right Thigh - Anterior  Skin / nail biopsy Type of biopsy: punch   Informed consent: discussed and consent obtained   Timeout: patient name, date of birth, surgical site, and procedure verified   Procedure prep:  Patient was prepped and draped in usual sterile fashion (Non sterile) Prep type:  Chlorhexidine Anesthesia: the lesion was anesthetized in a standard fashion   Anesthetic:  1% lidocaine w/ epinephrine 1-100,000 local infiltration Punch size:  2.5 mm Suture size:  4-0 Suture type: nylon   Suture removal (days):  10 Hemostasis achieved with: suture   Outcome: patient tolerated procedure well    Specimen 1 - Surgical pathology Differential Diagnosis: GA Check Margins: No  Atypical mole Right Foot - Anterior  Deferred wider shave till next visit. Lesion was still scabbed over.

## 2019-10-29 NOTE — Patient Instructions (Signed)

## 2019-11-04 ENCOUNTER — Ambulatory Visit: Payer: BC Managed Care – PPO

## 2019-11-04 ENCOUNTER — Telehealth: Payer: Self-pay

## 2019-11-04 MED ORDER — CLOBETASOL PROP EMOLLIENT BASE 0.05 % EX CREA: TOPICAL_CREAM | CUTANEOUS | 0 refills | Status: DC

## 2019-11-04 NOTE — Telephone Encounter (Signed)
She can have 1 tube of clobetasol cream to use twice a day for 2 -4 weeks. No refills

## 2019-11-04 NOTE — Telephone Encounter (Signed)
Pathology given to patient 

## 2019-11-04 NOTE — Addendum Note (Signed)
Addended by: Sheran Lawless on: 11/04/2019 11:12 AM   Modules accepted: Orders

## 2019-11-04 NOTE — Telephone Encounter (Signed)
-----   Message from Arlyss Gandy, [place] sent at 11/03/2019 11:29 AM EDT ----- Is GA. Can give rx for steroid when she comes in for sutures

## 2019-11-06 ENCOUNTER — Ambulatory Visit: Payer: BC Managed Care – PPO

## 2019-12-23 ENCOUNTER — Encounter: Payer: BC Managed Care – PPO | Admitting: Physician Assistant

## 2020-01-21 ENCOUNTER — Ambulatory Visit (INDEPENDENT_AMBULATORY_CARE_PROVIDER_SITE_OTHER): Payer: BC Managed Care – PPO | Admitting: Dermatology

## 2020-01-21 ENCOUNTER — Other Ambulatory Visit: Payer: Self-pay

## 2020-01-21 ENCOUNTER — Encounter: Payer: Self-pay | Admitting: Dermatology

## 2020-01-21 DIAGNOSIS — D229 Melanocytic nevi, unspecified: Secondary | ICD-10-CM

## 2020-01-21 DIAGNOSIS — Z1283 Encounter for screening for malignant neoplasm of skin: Secondary | ICD-10-CM

## 2020-01-21 DIAGNOSIS — L729 Follicular cyst of the skin and subcutaneous tissue, unspecified: Secondary | ICD-10-CM | POA: Diagnosis not present

## 2020-01-21 DIAGNOSIS — D2271 Melanocytic nevi of right lower limb, including hip: Secondary | ICD-10-CM

## 2020-01-21 NOTE — Patient Instructions (Signed)

## 2020-01-21 NOTE — Progress Notes (Signed)
Right foot 1.0 tx size

## 2020-01-28 ENCOUNTER — Encounter: Payer: BC Managed Care – PPO | Admitting: Dermatology

## 2020-01-28 ENCOUNTER — Encounter: Payer: BC Managed Care – PPO | Admitting: Physician Assistant

## 2020-03-11 ENCOUNTER — Telehealth: Payer: BC Managed Care – PPO | Admitting: Physician Assistant

## 2020-03-11 DIAGNOSIS — J208 Acute bronchitis due to other specified organisms: Secondary | ICD-10-CM | POA: Diagnosis not present

## 2020-03-11 DIAGNOSIS — R059 Cough, unspecified: Secondary | ICD-10-CM

## 2020-03-11 MED ORDER — ALBUTEROL SULFATE HFA 108 (90 BASE) MCG/ACT IN AERS: 2.0000 | INHALATION_SPRAY | RESPIRATORY_TRACT | 1 refills | Status: DC | PRN

## 2020-03-11 MED ORDER — BENZONATATE 100 MG PO CAPS
100.0000 mg | ORAL_CAPSULE | Freq: Three times a day (TID) | ORAL | 0 refills | Status: DC | PRN
Start: 2020-03-11 — End: 2020-07-19

## 2020-03-11 MED ORDER — PREDNISONE 10 MG PO TABS: ORAL_TABLET | ORAL | 0 refills | Status: DC

## 2020-03-11 MED ORDER — FLUTICASONE PROPIONATE 50 MCG/ACT NA SUSP: 2.0000 | Freq: Every day | NASAL | 0 refills | Status: DC

## 2020-03-11 NOTE — Progress Notes (Signed)
We are sorry that you are not feeling well.  Here is how we plan to help!  Based on your presentation I believe you most likely have A cough due to a virus.  This is called viral bronchitis and is best treated by rest, plenty of fluids and control of the cough.  You may use Ibuprofen or Tylenol as directed to help your symptoms.     In addition you may use A non-prescription cough medication called Robitussin DAC. Take 2 teaspoons every 8 hours or Delsym: take 2 teaspoons every 12 hours. and A prescription cough medication called Tessalon Perles 100mg . You may take 1-2 capsules every 8 hours as needed for your cough.  Prednisone 10 mg daily for 6 days (see taper instructions below)  Directions for 6 day taper: Day 1: 2 tablets before breakfast, 1 after both lunch & dinner and 2 at bedtime Day 2: 1 tab before breakfast, 1 after both lunch & dinner and 2 at bedtime Day 3: 1 tab at each meal & 1 at bedtime Day 4: 1 tab at breakfast, 1 at lunch, 1 at bedtime Day 5: 1 tab at breakfast & 1 tab at bedtime Day 6: 1 tab at breakfast   From your responses in the eVisit questionnaire you describe inflammation in the upper respiratory tract which is causing a significant cough.  This is commonly called Bronchitis and has four common causes:    Allergies  Viral Infections  Acid Reflux  Bacterial Infection Allergies, viruses and acid reflux are treated by controlling symptoms or eliminating the cause. An example might be a cough caused by taking certain blood pressure medications. You stop the cough by changing the medication. Another example might be a cough caused by acid reflux. Controlling the reflux helps control the cough.  USE OF BRONCHODILATOR ("RESCUE") INHALERS: There is a risk from using your bronchodilator too frequently.  The risk is that over-reliance on a medication which only relaxes the muscles surrounding the breathing tubes can reduce the effectiveness of medications prescribed to  reduce swelling and congestion of the tubes themselves.  Although you feel brief relief from the bronchodilator inhaler, your asthma may actually be worsening with the tubes becoming more swollen and filled with mucus.  This can delay other crucial treatments, such as oral steroid medications. If you need to use a bronchodilator inhaler daily, several times per day, you should discuss this with your provider.  There are probably better treatments that could be used to keep your asthma under control.     HOME CARE . Only take medications as instructed by your medical team. . Complete the entire course of an antibiotic. . Drink plenty of fluids and get plenty of rest. . Avoid close contacts especially the very young and the elderly . Cover your mouth if you cough or cough into your sleeve. . Always remember to wash your hands . A steam or ultrasonic humidifier can help congestion.   GET HELP RIGHT AWAY IF: . You develop worsening fever. . You become short of breath . You cough up blood. . Your symptoms persist after you have completed your treatment plan MAKE SURE YOU   Understand these instructions.  Will watch your condition.  Will get help right away if you are not doing well or get worse.  Your e-visit answers were reviewed by a board certified advanced clinical practitioner to complete your personal care plan.  Depending on the condition, your plan could have included both over the  counter or prescription medications. If there is a problem please reply  once you have received a response from your provider. Your safety is important to [place].  If you have drug allergies check your prescription carefully.    You can use MyChart to ask questions about today's visit, request a non-urgent call back, or ask for a work or school excuse for 24 hours related to this e-Visit. If it has been greater than 24 hours you will need to follow up with your provider, or enter a new e-Visit to address those  concerns. You will get an e-mail in the next two days asking about your experience.  I hope that your e-visit has been valuable and will speed your recovery. Thank you for using e-visits.  Greater than 5 minutes, yet less than 10 minutes of time have been spent researching, coordinating and implementing care for this patient today.

## 2020-06-13 ENCOUNTER — Encounter (HOSPITAL_COMMUNITY): Payer: Self-pay | Admitting: *Deleted

## 2020-06-13 NOTE — Progress Notes (Signed)
Received prior authorization for tretinoin cream for this patient.  She is not a patient at our clinic.  Sender notified.

## 2020-07-19 ENCOUNTER — Telehealth: Payer: BC Managed Care – PPO | Admitting: Physician Assistant

## 2020-07-19 DIAGNOSIS — B9689 Other specified bacterial agents as the cause of diseases classified elsewhere: Secondary | ICD-10-CM | POA: Diagnosis not present

## 2020-07-19 DIAGNOSIS — J019 Acute sinusitis, unspecified: Secondary | ICD-10-CM | POA: Diagnosis not present

## 2020-07-19 MED ORDER — PROMETHAZINE-DM 6.25-15 MG/5ML PO SYRP: 5.0000 mL | ORAL_SOLUTION | Freq: Four times a day (QID) | ORAL | 0 refills | Status: DC | PRN

## 2020-07-19 MED ORDER — AMOXICILLIN-POT CLAVULANATE 875-125 MG PO TABS: 1.0000 | ORAL_TABLET | Freq: Two times a day (BID) | ORAL | 0 refills | Status: DC

## 2020-07-19 NOTE — Addendum Note (Signed)
Addended by: Brunetta Jeans on: 07/19/2020 02:02 PM   Modules accepted: Orders

## 2020-07-19 NOTE — Progress Notes (Signed)

## 2020-07-19 NOTE — Progress Notes (Signed)
I have spent 5 minutes in review of e-visit questionnaire, review and updating patient chart, medical decision making and response to patient.   Kaeleen Odom Cody Taishaun Levels, PA-C    

## 2020-10-30 ENCOUNTER — Encounter: Payer: Self-pay | Admitting: Dermatology

## 2020-10-30 NOTE — Progress Notes (Signed)
   Follow-Up Visit   Subjective  Latasha Holmes is a 40 y.o. female who presents for the following: Procedure (Patient here today for widershave on right foot).  Atypical nevus right foot, patient would like other spots checked. Location:  Duration:  Quality:  Associated Signs/Symptoms: Modifying Factors:  Severity:  Timing: Context:   Objective  Well appearing patient in no apparent distress; mood and affect are within normal limits. Mid Back Check patient's back today and no signs of atypical moles or non mole skin cancer.  Left Instep Subcutaneous 8 mm mobile cyst.  Right Foot - Anterior Pathology showed moderate to severe atypia with positive margin, biopsies site identified by patient, nurse, and me.    A focused examination was performed including arms, legs, feet, back.. Relevant physical exam findings are noted in the Assessment and Plan.   Assessment & Plan    Skin exam for malignant neoplasm Mid Back  Clear exam of patient's back. Schedule yearly skin checks.  Cyst of skin Left Instep  May leave if stable.  If she does desire excision in this area I will refer her to a podiatrist.  Atypical mole Right Foot - Anterior  0.5 mm margin marked.  Epidermal / dermal shaving - Right Foot - Anterior  Lesion diameter (cm):  1 Informed consent: discussed and consent obtained   Timeout: patient name, date of birth, surgical site, and procedure verified   Anesthesia: the lesion was anesthetized in a standard fashion   Anesthetic:  1% lidocaine w/ epinephrine 1-100,000 local infiltration Instrument used: flexible razor blade   Hemostasis achieved with: aluminum chloride   Outcome: patient tolerated procedure well   Post-procedure details: sterile dressing applied and wound care instructions given   Dressing type: petrolatum and bandage    Specimen 1 - Surgical pathology Differential Diagnosis: R/O Atypia Check Margins: No [id number]      I,  Lavonna Monarch, MD, have reviewed all documentation for this visit.  The documentation on 10/30/20 for the exam, diagnosis, procedures, and orders are all accurate and complete.

## 2020-12-15 DIAGNOSIS — Z1231 Encounter for screening mammogram for malignant neoplasm of breast: Secondary | ICD-10-CM | POA: Diagnosis not present

## 2021-09-26 DIAGNOSIS — H5213 Myopia, bilateral: Secondary | ICD-10-CM | POA: Diagnosis not present

## 2021-09-26 DIAGNOSIS — H35412 Lattice degeneration of retina, left eye: Secondary | ICD-10-CM | POA: Diagnosis not present

## 2021-09-26 DIAGNOSIS — H16403 Unspecified corneal neovascularization, bilateral: Secondary | ICD-10-CM | POA: Diagnosis not present

## 2021-10-01 ENCOUNTER — Telehealth: Payer: BC Managed Care – PPO | Admitting: Family

## 2021-10-01 DIAGNOSIS — B029 Zoster without complications: Secondary | ICD-10-CM | POA: Diagnosis not present

## 2021-10-01 MED ORDER — GABAPENTIN 300 MG PO CAPS: 300.0000 mg | ORAL_CAPSULE | Freq: Three times a day (TID) | ORAL | 1 refills | Status: DC

## 2021-10-01 MED ORDER — VALACYCLOVIR HCL 1 G PO TABS: 1000.0000 mg | ORAL_TABLET | Freq: Three times a day (TID) | ORAL | 0 refills | Status: AC

## 2021-10-01 NOTE — Patient Instructions (Signed)

## 2021-10-01 NOTE — Progress Notes (Signed)
Virtual Visit Consent   Latasha Holmes, you are scheduled for a virtual visit with a [facility] provider today. Just as with appointments in the office, your consent must be obtained to participate. Your consent will be active for this visit and any virtual visit you may have with one of our providers in the next 365 days. If you have a MyChart account, a copy of this consent can be sent to you electronically.  As this is a virtual visit, video technology does not allow for your provider to perform a traditional examination. This may limit your provider's ability to fully assess your condition. If your provider identifies any concerns that need to be evaluated in person or the need to arrange testing (such as labs, EKG, etc.), we will make arrangements to do so. Although advances in technology are sophisticated, we cannot ensure that it will always work on either your end or our end. If the connection with a video visit is poor, the visit may have to be switched to a telephone visit. With either a video or telephone visit, we are not always able to ensure that we have a secure connection.  By engaging in this virtual visit, you consent to the provision of healthcare and authorize for your insurance to be billed (if applicable) for the services provided during this visit. Depending on your insurance coverage, you may receive a charge related to this service.  I need to obtain your verbal consent now. Are you willing to proceed with your visit today? Latasha Holmes has provided verbal consent on 10/01/2021 for a virtual visit (video or telephone). Evelina Dun, FNP  Date: 10/01/2021 1:06 PM  Virtual Visit via Video Note   I, Evelina Dun, connected with  Latasha Holmes  ([id number], 08-Jul-1980) on 10/01/21 at  1:00 PM EDT by a video-enabled telemedicine application and verified that I am speaking with the correct person using two identifiers.  Location: Patient: Virtual Visit Location  Patient: Home Provider: Virtual Visit Location Provider: Home Office   I discussed the limitations of evaluation and management by telemedicine and the availability of in person appointments. The patient expressed understanding and agreed to proceed.    History of Present Illness: Latasha Holmes is a 41 y.o. who identifies as a female who was assigned female at birth, and is being seen today for rash on that started yesterday on left shoulder and neck. She had burning and aching pain that started 3-4 days ago before the rash.   HPI: Rash This is a new problem. The current episode started yesterday. The problem has been gradually worsening since onset. The affected locations include the neck. The rash is characterized by blistering, pain and redness. She was exposed to nothing. Pertinent negatives include no fatigue, shortness of breath or sore throat. Treatments tried: motrin.    Problems:  Patient Active Problem List   Diagnosis Date Noted   Spider veins 06/29/2015   Varicose veins of lower extremities with other complications 40/98/1191    Allergies: No Known Allergies Medications:  Current Outpatient Medications:    gabapentin (NEURONTIN) 300 MG capsule, Take 1 capsule (300 mg total) by mouth 3 (three) times daily., Disp: 20 capsule, Rfl: 1   valACYclovir (VALTREX) 1000 MG tablet, Take 1 tablet (1,000 mg total) by mouth 3 (three) times daily for 7 days., Disp: 21 tablet, Rfl: 0  Observations/Objective: Patient is well-developed, well-nourished in no acute distress.  Resting comfortably at home.  Head is normocephalic, atraumatic.  No labored breathing. Speech is clear and coherent with logical content.  Patient is alert and oriented at baseline.  Red, blister rash on left shoulder  Assessment and Plan: 1. Herpes zoster without complication - valACYclovir (VALTREX) 1000 MG tablet; Take 1 tablet (1,000 mg total) by mouth 3 (three) times daily for 7 days.  Dispense: 21 tablet;  Refill: 0 - gabapentin (NEURONTIN) 300 MG capsule; Take 1 capsule (300 mg total) by mouth 3 (three) times daily.  Dispense: 20 capsule; Refill: 1  Start Valtrex TID for 7 days Gabapentin as needed for pain Avoid immune compromised and pregnant women Follow up if symptoms worsen or do not improve   Follow Up Instructions: I discussed the assessment and treatment plan with the patient. The patient was provided an opportunity to ask questions and all were answered. The patient agreed with the plan and demonstrated an understanding of the instructions.  A copy of instructions were sent to the patient via MyChart unless otherwise noted below.     The patient was advised to call back or seek an in-person evaluation if the symptoms worsen or if the condition fails to improve as anticipated.  Time:  I spent 8 minutes with the patient via telehealth technology discussing the above problems/concerns.    Evelina Dun, FNP'

## 2021-10-02 ENCOUNTER — Telehealth: Payer: BC Managed Care – PPO | Admitting: Physician Assistant

## 2021-10-02 DIAGNOSIS — B029 Zoster without complications: Secondary | ICD-10-CM

## 2021-10-02 NOTE — Progress Notes (Signed)
Duplicate encounter. Seen yesterday via VV.

## 2021-10-06 ENCOUNTER — Telehealth: Payer: BC Managed Care – PPO | Admitting: Physician Assistant

## 2021-10-06 DIAGNOSIS — B028 Zoster with other complications: Secondary | ICD-10-CM

## 2021-10-06 NOTE — Progress Notes (Signed)
Because you are having severe pain rating a 10/10, I feel your condition warrants further evaluation and I recommend that you be seen in a face to face visit.   NOTE: There will be NO CHARGE for this eVisit   If you are having a true medical emergency please call 911.      For an urgent face to face visit, [facility] has seven urgent care centers for your convenience:     [facility] [place] [facility] [place] Get Driving Directions [phone] [address] [place], [place] [address]    [facility]) Get Driving Directions [phone] [place], [place] [address]  [place] [facility] [place] ([place]) Get Driving Directions [phone] [address] [place],  [place]  [address]  [facility] - at [address] [place] Get Driving Directions  [phone] [address] [place],  [place] [address]   [facility] Get Driving Directions [phone] [place], [place], [place] [address]   [facility] Get Driving Directions  [phone] [address] [place], [place] [address]   [facility] [place] Get Driving Directions [phone] [address] [place], [place] [address]  Your MyChart E-visit questionnaire answers were reviewed by a board certified advanced clinical practitioner to complete your personal care plan based on your specific symptoms.  Thank you for using e-Visits.   I provided 5 minutes of non face-to-face time during this encounter for chart review and documentation.

## 2021-10-12 DIAGNOSIS — Z760 Encounter for issue of repeat prescription: Secondary | ICD-10-CM | POA: Diagnosis not present

## 2021-10-12 DIAGNOSIS — B0229 Other postherpetic nervous system involvement: Secondary | ICD-10-CM | POA: Diagnosis not present

## 2021-10-12 DIAGNOSIS — L259 Unspecified contact dermatitis, unspecified cause: Secondary | ICD-10-CM | POA: Diagnosis not present

## 2021-11-16 ENCOUNTER — Ambulatory Visit: Payer: BC Managed Care – PPO | Admitting: Family Medicine

## 2021-11-16 ENCOUNTER — Encounter: Payer: Self-pay | Admitting: Family Medicine

## 2021-11-16 VITALS — BP 128/87 | HR 95 | Temp 98.5°F | Ht 72.0 in | Wt 282.0 lb

## 2021-11-16 DIAGNOSIS — Z6838 Body mass index (BMI) 38.0-38.9, adult: Secondary | ICD-10-CM | POA: Diagnosis not present

## 2021-11-16 DIAGNOSIS — Z713 Dietary counseling and surveillance: Secondary | ICD-10-CM | POA: Diagnosis not present

## 2021-11-16 DIAGNOSIS — M2550 Pain in unspecified joint: Secondary | ICD-10-CM | POA: Insufficient documentation

## 2021-11-16 DIAGNOSIS — G479 Sleep disorder, unspecified: Secondary | ICD-10-CM | POA: Diagnosis not present

## 2021-11-16 MED ORDER — TRAZODONE HCL 100 MG PO TABS: 100.0000 mg | ORAL_TABLET | Freq: Every day | ORAL | 1 refills | Status: DC

## 2021-11-16 NOTE — Progress Notes (Signed)
Subjective:  Patient ID: Latasha Holmes, female    DOB: April 29, 1980, 41 y.o.   MRN: [id number]  Patient Care Team: Baruch Gouty, FNP as PCP - General (Family Medicine) Silvestre Gunner, Anderson Malta, PA-C (Inactive) (Dermatology) Lavonna Monarch, MD as Consulting Physician (Dermatology)   Chief Complaint:  New Patient (Initial Visit) (Establish care)   HPI: Latasha Holmes is a 41 y.o. female presenting on 11/16/2021 for New Patient (Initial Visit) (Establish care)   Pt presents today to establish care with new PCP. She is seen by OB/GYN on a regular basis. States she has not had a PCP in several years. She is recovering from shingles and did have postherpetic neuralgia for a brief time following rash outbreak. She complains of difficulty sleeping due to racing thoughts. Has tried melatonin in the past without relief of sleep disturbance. She states this happens after a difficult or busy day at work, not every night. She also struggles with weight. Was followed by a weight loss clinic in the past, on Mounjaro when there and it was working well. She does go to the gym at least 3 times per week, and plans on increasing this to at least 5 times per week. She has been watching her caloric intake without significant reduction in weight. She reports significant arthralgias and myalgias. Denies history of autoimmune disorders in self or family. No prior testing. Has been Vit D deficient in the past.       11/16/2021    8:21 AM  Depression screen PHQ 2/9  Decreased Interest 0  Down, Depressed, Hopeless 0  PHQ - 2 Score 0  Altered sleeping 2  Tired, decreased energy 0  Change in appetite 0  Feeling bad or failure about yourself  0  Trouble concentrating 0  Moving slowly or fidgety/restless 0  Suicidal thoughts 0  PHQ-9 Score 2  Difficult doing work/chores Somewhat difficult      11/16/2021    8:21 AM  GAD 7 : Generalized Anxiety Score  Nervous, Anxious, on Edge 1  Control/stop  worrying 0  Worry too much - different things 1  Trouble relaxing 1  Restless 0  Easily annoyed or irritable 0  Afraid - awful might happen 0  Total GAD 7 Score 3  Anxiety Difficulty Somewhat difficult      Relevant past medical, surgical, family, and social history reviewed and updated as indicated.  Allergies and medications reviewed and updated. Data reviewed: Chart in Epic.   Past Medical History:  Diagnosis Date   Atypical mole 10/08/2019   Right Foot-Anterior (moderate to severe)   Atypical mole 10/08/2019   Right Lower Back (moderate)   Varicose veins     Past Surgical History:  Procedure Laterality Date   ENDOVENOUS ABLATION SAPHENOUS VEIN W/ LASER Right 07-30-2013   endovenous laser ablation right greater saphenous vein and stab phlebectomy 10-20 incisions right leg by Curt Jews MD   ENDOVENOUS ABLATION SAPHENOUS VEIN W/ LASER Left 08-13-2013   EVLA  anterior branch of left greater saphenous vein and stab phlebectomy <10 incisions left leg by Curt Jews MD    Social History   Socioeconomic History   Marital status: Single    Spouse name: Not on file   Number of children: Not on file   Years of education: Not on file   Highest education level: Not on file  Occupational History   Not on file  Tobacco Use   Smoking status: Never   Smokeless tobacco: Never  Substance and Sexual Activity   Alcohol use: Yes    Alcohol/week: 2.0 standard drinks of alcohol    Types: 2 Glasses of wine per week   Drug use: No   Sexual activity: Not Currently  Other Topics Concern   Not on file  Social History Narrative   Not on file   Social Determinants of Health   Financial Resource Strain: Not on file  Food Insecurity: Not on file  Transportation Needs: Not on file  Physical Activity: Not on file  Stress: Not on file  Social Connections: Not on file  Intimate Partner Violence: Not on file    Outpatient Encounter Medications as of 11/16/2021  Medication Sig    ibuprofen (ADVIL) 800 MG tablet Take by mouth.   traZODone (DESYREL) 100 MG tablet Take 1 tablet (100 mg total) by mouth at bedtime.   [DISCONTINUED] gabapentin (NEURONTIN) 300 MG capsule Take 1 capsule (300 mg total) by mouth 3 (three) times daily.   No facility-administered encounter medications on file as of 11/16/2021.    No Known Allergies  Review of Systems  Constitutional:  Negative for activity change, appetite change, chills, diaphoresis, fatigue, fever and unexpected weight change.  HENT: Negative.    Eyes: Negative.  Negative for photophobia and visual disturbance.  Respiratory:  Negative for cough, chest tightness and shortness of breath.   Cardiovascular:  Negative for chest pain, palpitations and leg swelling.  Gastrointestinal:  Negative for abdominal pain, blood in stool, constipation, diarrhea, nausea and vomiting.  Endocrine: Negative.   Genitourinary:  Negative for decreased urine volume, difficulty urinating, dysuria, frequency and urgency.  Musculoskeletal:  Positive for arthralgias and myalgias. Negative for back pain, gait problem, joint swelling, neck pain and neck stiffness.  Skin: Negative.   Allergic/Immunologic: Negative.   Neurological:  Negative for dizziness and headaches.  Hematological: Negative.   Psychiatric/Behavioral:  Positive for sleep disturbance. Negative for agitation, behavioral problems, confusion, decreased concentration, dysphoric mood, hallucinations, self-injury and suicidal ideas. The patient is nervous/anxious. The patient is not hyperactive.   All other systems reviewed and are negative.       Objective:  BP 128/87   Pulse 95   Temp 98.5 F (36.9 C)   Ht 6' (1.829 m)   Wt 282 lb (127.9 kg)   SpO2 96%   BMI 38.25 kg/m    Wt Readings from Last 3 Encounters:  11/16/21 282 lb (127.9 kg)  08/20/13 230 lb (104.3 kg)  08/13/13 240 lb (108.9 kg)    Physical Exam Vitals and nursing note reviewed.  Constitutional:      General:  She is not in acute distress.    Appearance: Normal appearance. She is obese. She is not ill-appearing, toxic-appearing or diaphoretic.  HENT:     Head: Normocephalic and atraumatic.     Mouth/Throat:     Mouth: Mucous membranes are moist.  Eyes:     Conjunctiva/sclera: Conjunctivae normal.     Pupils: Pupils are equal, round, and reactive to light.  Cardiovascular:     Rate and Rhythm: Normal rate and regular rhythm.     Heart sounds: Normal heart sounds.  Pulmonary:     Effort: Pulmonary effort is normal.     Breath sounds: Normal breath sounds.  Musculoskeletal:     Cervical back: Normal range of motion and neck supple.     Right lower leg: No edema.     Left lower leg: No edema.  Skin:    General: Skin is warm  and dry.     Capillary Refill: Capillary refill takes less than 2 seconds.  Neurological:     General: No focal deficit present.     Mental Status: She is alert and oriented to person, place, and time.  Psychiatric:        Mood and Affect: Mood normal.        Behavior: Behavior normal.        Thought Content: Thought content normal.        Judgment: Judgment normal.     Results for orders placed or performed during the hospital encounter of 06/22/10  CBC  Result Value Ref Range   WBC 7.1 4.0 - 10.5 K/uL   RBC 4.25 3.87 - 5.11 MIL/uL   Hemoglobin 12.4 12.0 - 15.0 g/dL   HCT 37.2 36.0 - 46.0 %   MCV 87.5 78.0 - 100.0 fL   MCH 29.2 26.0 - 34.0 pg   MCHC 33.3 30.0 - 36.0 g/dL   RDW 13.1 11.5 - 15.5 %   Platelets 194 150 - 400 K/uL  RPR  Result Value Ref Range   RPR Ser Ql NON REACTIVE NON REACTIVE  CBC  Result Value Ref Range   WBC 11.7 (H) 4.0 - 10.5 K/uL   RBC 3.41 (L) 3.87 - 5.11 MIL/uL   Hemoglobin 9.9 DELTA CHECK NOTED REPEATED TO VERIFY (L) 12.0 - 15.0 g/dL   HCT 30.2 (L) 36.0 - 46.0 %   MCV 88.6 78.0 - 100.0 fL   MCH 29.0 26.0 - 34.0 pg   MCHC 32.8 30.0 - 36.0 g/dL   RDW 13.2 11.5 - 15.5 %   Platelets 161 150 - 400 K/uL       Pertinent labs &  imaging results that were available during my care of the patient were reviewed by me and considered in my medical decision making.  Assessment & Plan:  Aqua was seen today for new patient (initial visit).  Diagnoses and all orders for this visit:  BMI 38.0-38.9,adult Encounter for weight loss counseling Diet and exercise encouraged. Labs pending. Previously on Mounjaro and did well. Will see if insurance covers wegovy or saxenda and initiate if covered.  -     VITAMIN D 25 Hydroxy (Vit-D Deficiency, Fractures) -     CBC with Differential/Platelet -     CMP14+EGFR -     Thyroid Panel With TSH -     Lipid panel  Sleep disturbance Will check thyroid function. Trial trazodone as prescribed. Can add melatonin for additional sleep support. Report continued symptoms.  -     Thyroid Panel With TSH -     traZODone (DESYREL) 100 MG tablet; Take 1 tablet (100 mg total) by mouth at bedtime.  Arthralgia of multiple sites Labs pending. If unremarkable and symptoms persist, will check ANA for potential autoimmune disorders causing symptoms.  -     VITAMIN D 25 Hydroxy (Vit-D Deficiency, Fractures) -     CMP14+EGFR     Continue all other maintenance medications.  Follow up plan: Return in about 6 weeks (around 12/28/2021), or if symptoms worsen or fail to improve, for BMI.   Continue healthy lifestyle choices, including diet (rich in fruits, vegetables, and lean proteins, and low in salt and simple carbohydrates) and exercise (at least 30 minutes of moderate physical activity daily).   The above assessment and management plan was discussed with the patient. The patient verbalized understanding of and has agreed to the management plan. Patient is aware to  call the clinic if they develop any new symptoms or if symptoms persist or worsen. Patient is aware when to return to the clinic for a follow-up visit. Patient educated on when it is appropriate to go to the emergency department.    Monia Pouch, FNP-C [facility] [phone]

## 2021-11-16 NOTE — Patient Instructions (Signed)
Here is a guide to help us find out which weight loss medications will be covered by your insurance plan.  Please check out this web site  [email or web address] and follow the 3 simple steps.   Wegovy or Saxenda  There is also a phone number you can call if you do not have access to the Internet. [phone] (Monday- Friday 8am-8pm)  [employer] provides coverage information for more than 80% of the inquiries submitted!!  

## 2021-11-17 ENCOUNTER — Encounter: Payer: Self-pay | Admitting: Family Medicine

## 2021-11-17 LAB — LIPID PANEL
Chol/HDL Ratio: 2.3 ratio (ref 0.0–4.4)
Cholesterol, Total: 155 mg/dL (ref 100–199)
HDL: 66 mg/dL (ref >39)
LDL Chol Calc (NIH): 68 mg/dL (ref 0–99)
Triglycerides: 123 mg/dL (ref 0–149)
VLDL Cholesterol Cal: 21 mg/dL (ref 5–40)

## 2021-11-17 LAB — CBC WITH DIFFERENTIAL/PLATELET
Basophils Absolute: 0 10*3/uL (ref 0.0–0.2)
Basos: 0 %
EOS (ABSOLUTE): 0.1 10*3/uL (ref 0.0–0.4)
Eos: 1 %
Hematocrit: 42.1 % (ref 34.0–46.6)
Hemoglobin: 13.9 g/dL (ref 11.1–15.9)
Immature Grans (Abs): 0 10*3/uL (ref 0.0–0.1)
Immature Granulocytes: 0 %
Lymphocytes Absolute: 1.9 10*3/uL (ref 0.7–3.1)
Lymphs: 27 %
MCH: 28.5 pg (ref 26.6–33.0)
MCHC: 33 g/dL (ref 31.5–35.7)
MCV: 86 fL (ref 79–97)
Monocytes Absolute: 0.5 10*3/uL (ref 0.1–0.9)
Monocytes: 7 %
Neutrophils Absolute: 4.5 10*3/uL (ref 1.4–7.0)
Neutrophils: 65 %
Platelets: 264 10*3/uL (ref 150–450)
RBC: 4.87 x10E6/uL (ref 3.77–5.28)
RDW: 12.3 % (ref 11.7–15.4)
WBC: 7.1 10*3/uL (ref 3.4–10.8)

## 2021-11-17 LAB — CMP14+EGFR
ALT: 36 IU/L — ABNORMAL HIGH (ref 0–32)
AST: 23 IU/L (ref 0–40)
Albumin/Globulin Ratio: 1.8 (ref 1.2–2.2)
Albumin: 4.5 g/dL (ref 3.9–4.9)
Alkaline Phosphatase: 65 IU/L (ref 44–121)
BUN/Creatinine Ratio: 13 (ref 9–23)
BUN: 12 mg/dL (ref 6–24)
Bilirubin Total: 0.3 mg/dL (ref 0.0–1.2)
CO2: 20 mmol/L (ref 20–29)
Calcium: 10.1 mg/dL (ref 8.7–10.2)
Chloride: 100 mmol/L (ref 96–106)
Creatinine, Ser: 0.89 mg/dL (ref 0.57–1.00)
Globulin, Total: 2.5 g/dL (ref 1.5–4.5)
Glucose: 75 mg/dL (ref 70–99)
Potassium: 4.4 mmol/L (ref 3.5–5.2)
Sodium: 136 mmol/L (ref 134–144)
Total Protein: 7 g/dL (ref 6.0–8.5)
eGFR: 83 mL/min/{1.73_m2} (ref >59)

## 2021-11-17 LAB — THYROID PANEL WITH TSH
Free Thyroxine Index: 2.2 (ref 1.2–4.9)
T3 Uptake Ratio: 28 % (ref 24–39)
T4, Total: 7.8 ug/dL (ref 4.5–12.0)
TSH: 1.45 u[IU]/mL (ref 0.450–4.500)

## 2021-11-17 LAB — VITAMIN D 25 HYDROXY (VIT D DEFICIENCY, FRACTURES): Vit D, 25-Hydroxy: 37 ng/mL (ref 30.0–100.0)

## 2021-11-24 ENCOUNTER — Other Ambulatory Visit: Payer: Self-pay | Admitting: Family Medicine

## 2021-11-24 DIAGNOSIS — Z8639 Personal history of other endocrine, nutritional and metabolic disease: Secondary | ICD-10-CM

## 2021-11-24 MED ORDER — VITAMIN D3 75 MCG (3000 UT) PO TABS: 1.0000 | ORAL_TABLET | Freq: Every day | ORAL | 11 refills | Status: DC

## 2021-12-28 ENCOUNTER — Ambulatory Visit: Payer: BC Managed Care – PPO | Admitting: Family Medicine

## 2021-12-29 ENCOUNTER — Encounter: Payer: Self-pay | Admitting: Family Medicine

## 2021-12-29 ENCOUNTER — Ambulatory Visit: Payer: BC Managed Care – PPO | Admitting: Family Medicine

## 2021-12-29 VITALS — BP 133/83 | HR 69 | Temp 98.5°F | Ht 72.0 in | Wt 278.0 lb

## 2021-12-29 DIAGNOSIS — Z6838 Body mass index (BMI) 38.0-38.9, adult: Secondary | ICD-10-CM | POA: Diagnosis not present

## 2021-12-29 DIAGNOSIS — G479 Sleep disorder, unspecified: Secondary | ICD-10-CM

## 2021-12-29 DIAGNOSIS — Z713 Dietary counseling and surveillance: Secondary | ICD-10-CM | POA: Diagnosis not present

## 2021-12-29 MED ORDER — MOUNJARO 15 MG/0.5ML ~~LOC~~ SOAJ: 15.0000 mg | SUBCUTANEOUS | 3 refills | Status: AC

## 2021-12-29 MED ORDER — BELSOMRA 5 MG PO TABS: 5.0000 mg | ORAL_TABLET | Freq: Every evening | ORAL | 0 refills | Status: DC | PRN

## 2021-12-29 NOTE — Progress Notes (Signed)
Subjective:  Patient ID: Latasha Holmes, female    DOB: 1980/04/09, 41 y.o.   MRN: [id number]  Patient Care Team: Baruch Gouty, FNP as PCP - General (Family Medicine) Silvestre Gunner, Anderson Malta, PA-C (Inactive) (Dermatology) Lavonna Monarch, MD (Inactive) as Consulting Physician (Dermatology)   Chief Complaint:  Weight Check (Plus sleep disorder)   HPI: Latasha Holmes is a 40 y.o. female presenting on 12/29/2021 for Weight Check (Plus sleep disorder)   1. BMI 38.0-38.9,adult 2. Encounter for weight loss counseling Has been on mounjaro and tolerating well. Denies GI upset. Has lost 10 pounds to date. Has made dietary changes. No reported hypo- or hyperthyroid symptoms.   3. Sleep disturbance Has been taking melatonin and trazodone without relieve of sleep disturbances. States she will go to sleep but then wakes up after 2-3 hours and is not able to go back to sleep. Ongoing for months.      Relevant past medical, surgical, family, and social history reviewed and updated as indicated.  Allergies and medications reviewed and updated. Data reviewed: Chart in Epic.   Past Medical History:  Diagnosis Date   Atypical mole 10/08/2019   Right Foot-Anterior (moderate to severe)   Atypical mole 10/08/2019   Right Lower Back (moderate)   Varicose veins     Past Surgical History:  Procedure Laterality Date   ENDOVENOUS ABLATION SAPHENOUS VEIN W/ LASER Right 07-30-2013   endovenous laser ablation right greater saphenous vein and stab phlebectomy 10-20 incisions right leg by Curt Jews MD   ENDOVENOUS ABLATION SAPHENOUS VEIN W/ LASER Left 08-13-2013   EVLA  anterior branch of left greater saphenous vein and stab phlebectomy <10 incisions left leg by Curt Jews MD    Social History   Socioeconomic History   Marital status: Single    Spouse name: Not on file   Number of children: Not on file   Years of education: Not on file   Highest education level: Not on file   Occupational History   Not on file  Tobacco Use   Smoking status: Never   Smokeless tobacco: Never  Substance and Sexual Activity   Alcohol use: Yes    Alcohol/week: 2.0 standard drinks of alcohol    Types: 2 Glasses of wine per week   Drug use: No   Sexual activity: Not Currently  Other Topics Concern   Not on file  Social History Narrative   Not on file   Social Determinants of Health   Financial Resource Strain: Not on file  Food Insecurity: Not on file  Transportation Needs: Not on file  Physical Activity: Not on file  Stress: Not on file  Social Connections: Not on file  Intimate Partner Violence: Not on file    Outpatient Encounter Medications as of 12/29/2021  Medication Sig   Cholecalciferol (VITAMIN D3) 50 MCG (2000 UT) TABS TAKE 1 CAPSULE BY MOUTH ONCE DAILY   famotidine (PEPCID) 20 MG tablet Take 20 mg by mouth 2 (two) times daily.   ibuprofen (ADVIL) 800 MG tablet Take by mouth.   Melatonin 10 MG TABS Take by mouth.   Suvorexant (BELSOMRA) 5 MG TABS Take 5 mg by mouth at bedtime as needed.   [DISCONTINUED] MOUNJARO 15 MG/0.5ML Pen SMARTSIG:15 Milligram(s) SUB-Q Once a Week   [DISCONTINUED] traZODone (DESYREL) 100 MG tablet Take 1 tablet (100 mg total) by mouth at bedtime.   MOUNJARO 15 MG/0.5ML Pen Inject 15 mg into the skin once a week for 4 doses.   [  DISCONTINUED] Cholecalciferol (VITAMIN D3) 75 MCG (3000 UT) TABS Take 1 tablet by mouth daily.   No facility-administered encounter medications on file as of 12/29/2021.    No Known Allergies  Review of Systems  Constitutional:  Negative for activity change, appetite change, chills, diaphoresis, fatigue, fever and unexpected weight change.  HENT: Negative.    Eyes: Negative.  Negative for photophobia and visual disturbance.  Respiratory:  Negative for cough, chest tightness and shortness of breath.   Cardiovascular:  Negative for chest pain, palpitations and leg swelling.  Gastrointestinal:  Negative for  abdominal pain, blood in stool, constipation, diarrhea, nausea and vomiting.  Endocrine: Negative.  Negative for cold intolerance, heat intolerance, polydipsia, polyphagia and polyuria.  Genitourinary:  Negative for decreased urine volume, difficulty urinating, dysuria, frequency and urgency.  Musculoskeletal:  Negative for arthralgias and myalgias.  Skin: Negative.   Allergic/Immunologic: Negative.   Neurological:  Negative for dizziness and headaches.  Hematological: Negative.   Psychiatric/Behavioral:  Positive for sleep disturbance. Negative for confusion, decreased concentration, dysphoric mood, hallucinations, self-injury and suicidal ideas. The patient is not nervous/anxious and is not hyperactive.   All other systems reviewed and are negative.       Objective:  BP 133/83   Pulse 69   Temp 98.5 F (36.9 C)   Ht 6' (1.829 m)   Wt 278 lb (126.1 kg)   SpO2 98%   BMI 37.70 kg/m    Wt Readings from Last 3 Encounters:  12/29/21 278 lb (126.1 kg)  11/16/21 282 lb (127.9 kg)  08/20/13 230 lb (104.3 kg)    Physical Exam Vitals and nursing note reviewed.  Constitutional:      General: She is not in acute distress.    Appearance: Normal appearance. She is well-developed and well-groomed. She is not ill-appearing, toxic-appearing or diaphoretic.  HENT:     Head: Normocephalic and atraumatic.     Jaw: There is normal jaw occlusion.     Right Ear: Hearing normal.     Left Ear: Hearing normal.     Mouth/Throat:     Lips: Pink.     Mouth: Mucous membranes are moist.     Pharynx: Uvula midline.  Eyes:     General: Lids are normal.     Pupils: Pupils are equal, round, and reactive to light.  Neck:     Thyroid: No thyroid mass, thyromegaly or thyroid tenderness.     Vascular: No carotid bruit or JVD.     Trachea: Trachea and phonation normal.  Cardiovascular:     Rate and Rhythm: Normal rate and regular rhythm.     Chest Wall: PMI is not displaced.     Pulses: Normal  pulses.     Heart sounds: Normal heart sounds. No murmur heard.    No friction rub. No gallop.  Pulmonary:     Effort: Pulmonary effort is normal.     Breath sounds: Normal breath sounds.  Abdominal:     General: There is no abdominal bruit.     Palpations: There is no hepatomegaly or splenomegaly.  Musculoskeletal:        General: Normal range of motion.     Cervical back: Normal range of motion and neck supple.     Right lower leg: No edema.     Left lower leg: No edema.  Lymphadenopathy:     Cervical: No cervical adenopathy.  Skin:    General: Skin is warm and dry.     Capillary Refill: Capillary  refill takes less than 2 seconds.     Coloration: Skin is not cyanotic, jaundiced or pale.     Findings: No rash.  Neurological:     General: No focal deficit present.     Mental Status: She is alert and oriented to person, place, and time.     Sensory: Sensation is intact.     Motor: Motor function is intact.     Coordination: Coordination is intact.     Gait: Gait is intact.     Deep Tendon Reflexes: Reflexes are normal and symmetric.  Psychiatric:        Attention and Perception: Attention and perception normal.        Mood and Affect: Mood and affect normal.        Speech: Speech normal.        Behavior: Behavior normal. Behavior is cooperative.        Thought Content: Thought content normal.        Cognition and Memory: Cognition and memory normal.        Judgment: Judgment normal.     Results for orders placed or performed in visit on 11/16/21  VITAMIN D 25 Hydroxy (Vit-D Deficiency, Fractures)  Result Value Ref Range   Vit D, 25-Hydroxy 37.0 30.0 - 100.0 ng/mL  CBC with Differential/Platelet  Result Value Ref Range   WBC 7.1 3.4 - 10.8 x10E3/uL   RBC 4.87 3.77 - 5.28 x10E6/uL   Hemoglobin 13.9 11.1 - 15.9 g/dL   Hematocrit 42.1 34.0 - 46.6 %   MCV 86 79 - 97 fL   MCH 28.5 26.6 - 33.0 pg   MCHC 33.0 31.5 - 35.7 g/dL   RDW 12.3 11.7 - 15.4 %   Platelets 264 150 -  450 x10E3/uL   Neutrophils 65 Not Estab. %   Lymphs 27 Not Estab. %   Monocytes 7 Not Estab. %   Eos 1 Not Estab. %   Basos 0 Not Estab. %   Neutrophils Absolute 4.5 1.4 - 7.0 x10E3/uL   Lymphocytes Absolute 1.9 0.7 - 3.1 x10E3/uL   Monocytes Absolute 0.5 0.1 - 0.9 x10E3/uL   EOS (ABSOLUTE) 0.1 0.0 - 0.4 x10E3/uL   Basophils Absolute 0.0 0.0 - 0.2 x10E3/uL   Immature Granulocytes 0 Not Estab. %   Immature Grans (Abs) 0.0 0.0 - 0.1 x10E3/uL  CMP14+EGFR  Result Value Ref Range   Glucose 75 70 - 99 mg/dL   BUN 12 6 - 24 mg/dL   Creatinine, Ser 0.89 0.57 - 1.00 mg/dL   eGFR 83 >59 mL/min/1.73   BUN/Creatinine Ratio 13 9 - 23   Sodium 136 134 - 144 mmol/L   Potassium 4.4 3.5 - 5.2 mmol/L   Chloride 100 96 - 106 mmol/L   CO2 20 20 - 29 mmol/L   Calcium 10.1 8.7 - 10.2 mg/dL   Total Protein 7.0 6.0 - 8.5 g/dL   Albumin 4.5 3.9 - 4.9 g/dL   Globulin, Total 2.5 1.5 - 4.5 g/dL   Albumin/Globulin Ratio 1.8 1.2 - 2.2   Bilirubin Total 0.3 0.0 - 1.2 mg/dL   Alkaline Phosphatase 65 44 - 121 IU/L   AST 23 0 - 40 IU/L   ALT 36 (H) 0 - 32 IU/L  Thyroid Panel With TSH  Result Value Ref Range   TSH 1.450 0.450 - 4.500 uIU/mL   T4, Total 7.8 4.5 - 12.0 ug/dL   T3 Uptake Ratio 28 24 - 39 %   Free Thyroxine Index  2.2 1.2 - 4.9  Lipid panel  Result Value Ref Range   Cholesterol, Total 155 100 - 199 mg/dL   Triglycerides 123 0 - 149 mg/dL   HDL 66 >39 mg/dL   VLDL Cholesterol Cal 21 5 - 40 mg/dL   LDL Chol Calc (NIH) 68 0 - 99 mg/dL   Chol/HDL Ratio 2.3 0.0 - 4.4 ratio       Pertinent labs & imaging results that were available during my care of the patient were reviewed by me and considered in my medical decision making.  Assessment & Plan:  Soni was seen today for weight check.  Diagnoses and all orders for this visit:  BMI 38.0-38.9,adult Encounter for weight loss counseling Doing well on below, will continue. Diet and exercise encouraged.  -     CMP14+EGFR -     Thyroid  Panel With TSH -     MOUNJARO 15 MG/0.5ML Pen; Inject 15 mg into the skin once a week for 4 doses.  Sleep disturbance Tried and failed melatonin and trazodone. Will trial below to see if beneficial. Sleep hygiene discussed in detail.  -     CMP14+EGFR -     Thyroid Panel With TSH -     Suvorexant (BELSOMRA) 5 MG TABS; Take 5 mg by mouth at bedtime as needed.     Continue all other maintenance medications.  Follow up plan: Return in about 3 months (around 03/30/2022), or if symptoms worsen or fail to improve.   Continue healthy lifestyle choices, including diet (rich in fruits, vegetables, and lean proteins, and low in salt and simple carbohydrates) and exercise (at least 30 minutes of moderate physical activity daily).   The above assessment and management plan was discussed with the patient. The patient verbalized understanding of and has agreed to the management plan. Patient is aware to call the clinic if they develop any new symptoms or if symptoms persist or worsen. Patient is aware when to return to the clinic for a follow-up visit. Patient educated on when it is appropriate to go to the emergency department.   Monia Pouch, FNP-C [facility] [phone]

## 2021-12-30 LAB — THYROID PANEL WITH TSH
Free Thyroxine Index: 2.3 (ref 1.2–4.9)
T3 Uptake Ratio: 27 % (ref 24–39)
T4, Total: 8.5 ug/dL (ref 4.5–12.0)
TSH: 1.34 u[IU]/mL (ref 0.450–4.500)

## 2021-12-30 LAB — CMP14+EGFR
ALT: 38 IU/L — ABNORMAL HIGH (ref 0–32)
AST: 21 IU/L (ref 0–40)
Albumin/Globulin Ratio: 2.2 (ref 1.2–2.2)
Albumin: 4.7 g/dL (ref 3.9–4.9)
Alkaline Phosphatase: 67 IU/L (ref 44–121)
BUN/Creatinine Ratio: 11 (ref 9–23)
BUN: 10 mg/dL (ref 6–24)
Bilirubin Total: 0.3 mg/dL (ref 0.0–1.2)
CO2: 20 mmol/L (ref 20–29)
Calcium: 9.8 mg/dL (ref 8.7–10.2)
Chloride: 101 mmol/L (ref 96–106)
Creatinine, Ser: 0.9 mg/dL (ref 0.57–1.00)
Globulin, Total: 2.1 g/dL (ref 1.5–4.5)
Glucose: 90 mg/dL (ref 70–99)
Potassium: 4.5 mmol/L (ref 3.5–5.2)
Sodium: 137 mmol/L (ref 134–144)
Total Protein: 6.8 g/dL (ref 6.0–8.5)
eGFR: 82 mL/min/{1.73_m2} (ref >59)

## 2022-01-01 DIAGNOSIS — Z1231 Encounter for screening mammogram for malignant neoplasm of breast: Secondary | ICD-10-CM | POA: Diagnosis not present

## 2022-01-01 DIAGNOSIS — Z6836 Body mass index (BMI) 36.0-36.9, adult: Secondary | ICD-10-CM | POA: Diagnosis not present

## 2022-01-01 DIAGNOSIS — Z01419 Encounter for gynecological examination (general) (routine) without abnormal findings: Secondary | ICD-10-CM | POA: Diagnosis not present

## 2022-01-02 LAB — HM MAMMOGRAPHY: HM Mammogram: NORMAL (ref 0–4)

## 2022-01-02 MED ORDER — BELSOMRA 10 MG PO TABS: 10.0000 mg | ORAL_TABLET | Freq: Every evening | ORAL | 0 refills | Status: DC | PRN

## 2022-01-02 NOTE — Addendum Note (Signed)
Addended by: Baruch Gouty on: 01/02/2022 12:55 PM   Modules accepted: Orders

## 2022-01-25 ENCOUNTER — Other Ambulatory Visit: Payer: Self-pay | Admitting: Family Medicine

## 2022-01-25 DIAGNOSIS — G479 Sleep disorder, unspecified: Secondary | ICD-10-CM

## 2022-01-25 MED ORDER — ESZOPICLONE 2 MG PO TABS: 2.0000 mg | ORAL_TABLET | Freq: Every evening | ORAL | 0 refills | Status: DC | PRN

## 2022-01-25 NOTE — Progress Notes (Signed)
Belsomra was not beneficial, cancelled and sent in lunesta to see if beneficial.

## 2022-03-01 ENCOUNTER — Encounter: Payer: Self-pay | Admitting: Family Medicine

## 2022-03-14 ENCOUNTER — Ambulatory Visit: Payer: BC Managed Care – PPO | Admitting: Family Medicine

## 2022-03-14 ENCOUNTER — Encounter: Payer: Self-pay | Admitting: Family Medicine

## 2022-03-14 VITALS — BP 130/83 | HR 62 | Temp 97.3°F | Ht 73.0 in | Wt 262.0 lb

## 2022-03-14 DIAGNOSIS — R110 Nausea: Secondary | ICD-10-CM

## 2022-03-14 DIAGNOSIS — F411 Generalized anxiety disorder: Secondary | ICD-10-CM | POA: Diagnosis not present

## 2022-03-14 DIAGNOSIS — G479 Sleep disorder, unspecified: Secondary | ICD-10-CM

## 2022-03-14 DIAGNOSIS — Z713 Dietary counseling and surveillance: Secondary | ICD-10-CM

## 2022-03-14 DIAGNOSIS — Z6838 Body mass index (BMI) 38.0-38.9, adult: Secondary | ICD-10-CM | POA: Diagnosis not present

## 2022-03-14 MED ORDER — BUSPIRONE HCL 7.5 MG PO TABS: 7.5000 mg | ORAL_TABLET | Freq: Three times a day (TID) | ORAL | 1 refills | Status: DC

## 2022-03-14 MED ORDER — MOUNJARO 15 MG/0.5ML ~~LOC~~ SOAJ: 15.0000 mg | SUBCUTANEOUS | 3 refills | Status: DC

## 2022-03-14 MED ORDER — ESZOPICLONE 3 MG PO TABS: 3.0000 mg | ORAL_TABLET | Freq: Every day | ORAL | 3 refills | Status: DC

## 2022-03-14 MED ORDER — ONDANSETRON HCL 4 MG PO TABS: 4.0000 mg | ORAL_TABLET | Freq: Three times a day (TID) | ORAL | 0 refills | Status: AC | PRN

## 2022-03-14 MED ORDER — TRAZODONE HCL 100 MG PO TABS: 200.0000 mg | ORAL_TABLET | Freq: Every day | ORAL | 1 refills | Status: DC

## 2022-03-14 NOTE — Progress Notes (Signed)
Subjective:  Patient ID: Latasha Holmes, female    DOB: 04-17-1980, 41 y.o.   MRN: [id number]  Patient Care Team: Baruch Gouty, FNP as PCP - General (Family Medicine) Clark-Burning, Anderson Malta, PA-C (Inactive) (Dermatology) Lavonna Monarch, MD (Inactive) as Consulting Physician (Dermatology)   Chief Complaint:  BMI (3 month follow up ) and Nausea (X 2 days)   HPI: Latasha Holmes is a 41 y.o. female presenting on 03/14/2022 for BMI (3 month follow up ) and Nausea (X 2 days)   1. Sleep disturbance 2. GAD (generalized anxiety disorder) Has been alternating nights with trazodone and lunesta. This seems to be beneficial but still reports incomplete sleep due to awaking and not being able to go back to sleep due to racing thoughts.     03/14/2022   12:02 PM 12/29/2021    9:09 AM 11/16/2021    8:21 AM  Depression screen PHQ 2/9  Decreased Interest 0 0 0  Down, Depressed, Hopeless 0 0 0  PHQ - 2 Score 0 0 0  Altered sleeping _0 Tired, decreased energy 1 0 0  Change in appetite 0 0 0  Feeling bad or failure about yourself  0 0 0  Trouble concentrating 0 0 0  Moving slowly or fidgety/restless 0 0 0  Suicidal thoughts 0 0 0  PHQ-9 Score _1 Difficult doing work/chores Not difficult at all Somewhat difficult Somewhat difficult      03/14/2022   12:02 PM 12/29/2021    9:09 AM 11/16/2021    8:21 AM  GAD 7 : Generalized Anxiety Score  Nervous, Anxious, on Edge _2 Control/stop worrying 1 0 0  Worry too much - different things _3 Trouble relaxing _4 Restless 0 0 0  Easily annoyed or irritable 0 1 0  Afraid - awful might happen 0 0 0  Total GAD 7 Score _5 Anxiety Difficulty Somewhat difficult Somewhat difficult Somewhat difficult     3. Weight management Has been doing very well on Mounjaro, has had some slight nausea but no vomiting. Is down 16 lbs from last visit.      Relevant past medical, surgical, family, and social history reviewed and  updated as indicated.  Allergies and medications reviewed and updated. Data reviewed: Chart in Epic.   Past Medical History:  Diagnosis Date   Atypical mole 10/08/2019   Right Foot-Anterior (moderate to severe)   Atypical mole 10/08/2019   Right Lower Back (moderate)   Varicose veins     Past Surgical History:  Procedure Laterality Date   ENDOVENOUS ABLATION SAPHENOUS VEIN W/ LASER Right 07-30-2013   endovenous laser ablation right greater saphenous vein and stab phlebectomy 10-20 incisions right leg by Curt Jews MD   ENDOVENOUS ABLATION SAPHENOUS VEIN W/ LASER Left 08-13-2013   EVLA  anterior branch of left greater saphenous vein and stab phlebectomy <10 incisions left leg by Curt Jews MD    Social History   Socioeconomic History   Marital status: Single    Spouse name: Not on file   Number of children: Not on file   Years of education: Not on file   Highest education level: Not on file  Occupational History   Not on file  Tobacco Use   Smoking status: Never   Smokeless tobacco: Never  Substance and Sexual Activity   Alcohol use: Yes    Alcohol/week: 2.0 standard  drinks of alcohol    Types: 2 Glasses of wine per week   Drug use: No   Sexual activity: Not Currently  Other Topics Concern   Not on file  Social History Narrative   Not on file   Social Determinants of Health   Financial Resource Strain: Not on file  Food Insecurity: Not on file  Transportation Needs: Not on file  Physical Activity: Not on file  Stress: Not on file  Social Connections: Not on file  Intimate Partner Violence: Not on file    Outpatient Encounter Medications as of 03/14/2022  Medication Sig   busPIRone (BUSPAR) 7.5 MG tablet Take 1 tablet (7.5 mg total) by mouth 3 (three) times daily.   Cholecalciferol (VITAMIN D3) 50 MCG (2000 UT) TABS TAKE 1 CAPSULE BY MOUTH ONCE DAILY   Eszopiclone 3 MG TABS Take 1 tablet (3 mg total) by mouth at bedtime. Take immediately before bedtime    famotidine (PEPCID) 20 MG tablet Take 20 mg by mouth 2 (two) times daily.   ibuprofen (ADVIL) 800 MG tablet Take by mouth.   Melatonin 10 MG TABS Take by mouth.   ondansetron (ZOFRAN) 4 MG tablet Take 1 tablet (4 mg total) by mouth every 8 (eight) hours as needed for nausea or vomiting.   [DISCONTINUED] eszopiclone (LUNESTA) 2 MG TABS tablet Take 1 tablet (2 mg total) by mouth at bedtime as needed for sleep. Take immediately before bedtime   [DISCONTINUED] MOUNJARO 15 MG/0.5ML Pen Inject 15 mg into the skin once a week.   [DISCONTINUED] traZODone (DESYREL) 100 MG tablet Take 100 mg by mouth at bedtime.   MOUNJARO 15 MG/0.5ML Pen Inject 15 mg into the skin once a week.   traZODone (DESYREL) 100 MG tablet Take 2 tablets (200 mg total) by mouth at bedtime.   No facility-administered encounter medications on file as of 03/14/2022.    No Known Allergies  Review of Systems  Constitutional:  Positive for fatigue. Negative for activity change, appetite change, chills, diaphoresis, fever and unexpected weight change.  Eyes:  Negative for photophobia and visual disturbance.  Respiratory:  Negative for cough and shortness of breath.   Cardiovascular:  Negative for chest pain, palpitations and leg swelling.  Gastrointestinal:  Positive for nausea. Negative for abdominal distention, abdominal pain, anal bleeding, blood in stool, constipation, diarrhea, rectal pain and vomiting.  Endocrine: Negative for cold intolerance, heat intolerance, polydipsia, polyphagia and polyuria.  Genitourinary:  Negative for decreased urine volume and difficulty urinating.  Musculoskeletal:  Negative for arthralgias and myalgias.  Neurological:  Negative for dizziness, tremors, seizures, syncope, facial asymmetry, speech difficulty, weakness, light-headedness, numbness and headaches.  Hematological:  Negative for adenopathy. Does not bruise/bleed easily.  Psychiatric/Behavioral:  Positive for sleep disturbance. Negative for  agitation, behavioral problems, confusion, decreased concentration, dysphoric mood, hallucinations, self-injury and suicidal ideas. The patient is nervous/anxious. The patient is not hyperactive.   All other systems reviewed and are negative.       Objective:  BP 130/83   Pulse 62   Temp (!) 97.3 F (36.3 C) (Temporal)   Ht _0  (1.854 m)   Wt 262 lb (118.8 kg)   SpO2 98%   BMI 34.57 kg/m    Wt Readings from Last 3 Encounters:  03/14/22 262 lb (118.8 kg)  12/29/21 278 lb (126.1 kg)  11/16/21 282 lb (127.9 kg)    Physical Exam Vitals and nursing note reviewed.  Constitutional:      General: She is not in  acute distress.    Appearance: Normal appearance. She is obese. She is not ill-appearing, toxic-appearing or diaphoretic.  HENT:     Head: Normocephalic and atraumatic.     Mouth/Throat:     Mouth: Mucous membranes are moist.  Eyes:     Pupils: Pupils are equal, round, and reactive to light.  Cardiovascular:     Rate and Rhythm: Normal rate and regular rhythm.     Heart sounds: Normal heart sounds.  Pulmonary:     Effort: Pulmonary effort is normal.     Breath sounds: Normal breath sounds.  Abdominal:     General: Bowel sounds are normal. There is no distension.     Palpations: Abdomen is soft.     Tenderness: There is no abdominal tenderness.  Musculoskeletal:     Right lower leg: No edema.     Left lower leg: No edema.  Skin:    General: Skin is warm and dry.     Capillary Refill: Capillary refill takes less than 2 seconds.  Neurological:     General: No focal deficit present.     Mental Status: She is alert and oriented to person, place, and time.  Psychiatric:        Mood and Affect: Mood normal.        Behavior: Behavior normal.        Thought Content: Thought content normal.        Judgment: Judgment normal.     Results for orders placed or performed in visit on 12/29/21  CMP14+EGFR  Result Value Ref Range   Glucose 90 70 - 99 mg/dL   BUN 10 6 -  24 mg/dL   Creatinine, Ser 0.90 0.57 - 1.00 mg/dL   eGFR 82 >59 mL/min/1.73   BUN/Creatinine Ratio 11 9 - 23   Sodium 137 134 - 144 mmol/L   Potassium 4.5 3.5 - 5.2 mmol/L   Chloride 101 96 - 106 mmol/L   CO2 20 20 - 29 mmol/L   Calcium 9.8 8.7 - 10.2 mg/dL   Total Protein 6.8 6.0 - 8.5 g/dL   Albumin 4.7 3.9 - 4.9 g/dL   Globulin, Total 2.1 1.5 - 4.5 g/dL   Albumin/Globulin Ratio 2.2 1.2 - 2.2   Bilirubin Total 0.3 0.0 - 1.2 mg/dL   Alkaline Phosphatase 67 44 - 121 IU/L   AST 21 0 - 40 IU/L   ALT 38 (H) 0 - 32 IU/L  Thyroid Panel With TSH  Result Value Ref Range   TSH 1.340 0.450 - 4.500 uIU/mL   T4, Total 8.5 4.5 - 12.0 ug/dL   T3 Uptake Ratio 27 24 - 39 %   Free Thyroxine Index 2.3 1.2 - 4.9       Pertinent labs & imaging results that were available during my care of the patient were reviewed by me and considered in my medical decision making.  Assessment & Plan:  Lockie was seen today for bmi and nausea.  Diagnoses and all orders for this visit:  Sleep disturbance GAD Has been alternating below with some improvement in symptoms. Still has wakeful nights which are triggered by racing thoughts. Discussed anxiety in detail and probable need for treatment. Agrees to as needed buspar for now. Pt aware to report new, worsening, or persistent symptoms.  -     traZODone (DESYREL) 100 MG tablet; Take 2 tablets (200 mg total) by mouth at bedtime. -     Eszopiclone 3 MG TABS; Take 1 tablet (3  mg total) by mouth at bedtime. Take immediately before bedtime -     busPIRone (BUSPAR) 7.5 MG tablet; Take 1 tablet (7.5 mg total) by mouth 3 (three) times daily.  Nausea in adult Will provide below as needed. Report new, worsening, or persistent symptoms.  -     ondansetron (ZOFRAN) 4 MG tablet; Take 1 tablet (4 mg total) by mouth every 8 (eight) hours as needed for nausea or vomiting.  BMI 38.0-38.9,adult Encounter for weight loss counseling Has been doing great with current regimen  but does have some nausea. Will add Zofran as needed for nausea.  -     MOUNJARO 15 MG/0.5ML Pen; Inject 15 mg into the skin once a week.     Continue all other maintenance medications.  Follow up plan: Return in about 3 months (around 06/13/2022), or if symptoms worsen or fail to improve, for CPE.   Continue healthy lifestyle choices, including diet (rich in fruits, vegetables, and lean proteins, and low in salt and simple carbohydrates) and exercise (at least 30 minutes of moderate physical activity daily).    The above assessment and management plan was discussed with the patient. The patient verbalized understanding of and has agreed to the management plan. Patient is aware to call the clinic if they develop any new symptoms or if symptoms persist or worsen. Patient is aware when to return to the clinic for a follow-up visit. Patient educated on when it is appropriate to go to the emergency department.   Monia Pouch, FNP-C [facility] [phone]

## 2022-03-19 ENCOUNTER — Other Ambulatory Visit: Payer: Self-pay

## 2022-03-19 NOTE — Progress Notes (Signed)
Mammo abstracted

## 2022-04-03 ENCOUNTER — Ambulatory Visit: Payer: BC Managed Care – PPO | Admitting: Family Medicine

## 2022-04-06 ENCOUNTER — Encounter: Payer: Self-pay | Admitting: Family Medicine

## 2022-04-09 ENCOUNTER — Other Ambulatory Visit (HOSPITAL_COMMUNITY): Payer: Self-pay

## 2022-04-09 ENCOUNTER — Telehealth: Payer: Self-pay

## 2022-04-09 ENCOUNTER — Other Ambulatory Visit: Payer: Self-pay | Admitting: Family Medicine

## 2022-04-09 DIAGNOSIS — G479 Sleep disorder, unspecified: Secondary | ICD-10-CM

## 2022-04-09 NOTE — Telephone Encounter (Signed)
Pharmacy Patient Advocate Encounter   Received notification from [employer] that prior authorization for Mounjaro '15MG'$ /0.5ML pen-injectors is required/requested.   PA submitted on 04/09/22 to (ins) [employer] via CoverMyMeds [place]  Status is pending

## 2022-04-10 NOTE — Telephone Encounter (Signed)
Pharmacy Patient Advocate Encounter  Received notification from [employer] that the request for prior authorization for Mounjaro '15MG'$ /0.5ML pen-injectors has been cancelled due to:      How would you like to proceed?  Please be advised appeals may take up to 5 business days to be submitted as pharmacist prepares necessary documentation.  Thank you!

## 2022-05-11 ENCOUNTER — Other Ambulatory Visit: Payer: Self-pay | Admitting: Family Medicine

## 2022-05-11 DIAGNOSIS — F411 Generalized anxiety disorder: Secondary | ICD-10-CM

## 2022-06-14 ENCOUNTER — Encounter: Payer: Self-pay | Admitting: Family Medicine

## 2022-06-14 ENCOUNTER — Ambulatory Visit (INDEPENDENT_AMBULATORY_CARE_PROVIDER_SITE_OTHER): Payer: BC Managed Care – PPO | Admitting: Family Medicine

## 2022-06-14 VITALS — BP 118/78 | HR 73 | Temp 98.1°F | Ht 73.0 in | Wt 256.4 lb

## 2022-06-14 DIAGNOSIS — Z130 Encounter for screening for diseases of the blood and blood-forming organs and certain disorders involving the immune mechanism: Secondary | ICD-10-CM

## 2022-06-14 DIAGNOSIS — Z79899 Other long term (current) drug therapy: Secondary | ICD-10-CM | POA: Insufficient documentation

## 2022-06-14 DIAGNOSIS — Z713 Dietary counseling and surveillance: Secondary | ICD-10-CM

## 2022-06-14 DIAGNOSIS — H60313 Diffuse otitis externa, bilateral: Secondary | ICD-10-CM

## 2022-06-14 DIAGNOSIS — Z8639 Personal history of other endocrine, nutritional and metabolic disease: Secondary | ICD-10-CM | POA: Diagnosis not present

## 2022-06-14 DIAGNOSIS — G479 Sleep disorder, unspecified: Secondary | ICD-10-CM

## 2022-06-14 DIAGNOSIS — Z6838 Body mass index (BMI) 38.0-38.9, adult: Secondary | ICD-10-CM

## 2022-06-14 DIAGNOSIS — Z0000 Encounter for general adult medical examination without abnormal findings: Secondary | ICD-10-CM

## 2022-06-14 DIAGNOSIS — Z13220 Encounter for screening for lipoid disorders: Secondary | ICD-10-CM

## 2022-06-14 DIAGNOSIS — Z1329 Encounter for screening for other suspected endocrine disorder: Secondary | ICD-10-CM

## 2022-06-14 MED ORDER — CIPROFLOXACIN-DEXAMETHASONE 0.3-0.1 % OT SUSP: 4.0000 [drp] | Freq: Two times a day (BID) | OTIC | 0 refills | Status: DC

## 2022-06-14 MED ORDER — ESZOPICLONE 3 MG PO TABS: 3.0000 mg | ORAL_TABLET | Freq: Every day | ORAL | 5 refills | Status: DC

## 2022-06-14 MED ORDER — MOUNJARO 15 MG/0.5ML ~~LOC~~ SOAJ: 15.0000 mg | SUBCUTANEOUS | 3 refills | Status: DC

## 2022-06-14 NOTE — Progress Notes (Signed)
Complete physical exam  Patient: Latasha Holmes   DOB: 1980/11/05   42 y.o. Female  MRN: [id number]  Subjective:    Chief Complaint  Patient presents with   Annual Exam    Latasha Holmes is a 42 y.o. female who presents today for a complete physical exam. She reports consuming a general diet.  She exercises on a regular basis and tolerates well  She generally feels well. She reports sleeping fairly well. She does have additional problems to discuss today.   She reports bilateral ear itching and discomfort for the last few days, canals not eardrum. Denies recent swimming or getting water in ears. Does not use earbuds.  She has insomnia and has been doing well with alternating trazodone and lunesta on a nightly basis. Denies excessive daytime fatigue or brain fog. Will update CSA and toxassure today. PDMP reviewed, no red flags.  She has been doing very well on Mounjaro for weight management but states the medications have been out of stock. She continues with her diet and exercise but has not had the medications in several weeks.   Most recent fall risk assessment:    06/14/2022    8:35 AM  Fall Risk   Falls in the past year? 0     Most recent depression screenings:    06/14/2022    8:36 AM 03/14/2022   12:02 PM  PHQ 2/9 Scores  PHQ - 2 Score 2 0  PHQ- 9 Score 3 3    Vision:Within last year and Dental: No current dental problems and Receives regular dental care  Patient Active Problem List   Diagnosis Date Noted   History of vitamin D deficiency 06/14/2022   Controlled substance agreement signed 06/14/2022   BMI 38.0-38.9,adult 11/16/2021   Sleep disturbance 11/16/2021   Arthralgia of multiple sites 11/16/2021   Past Medical History:  Diagnosis Date   Atypical mole 10/08/2019   Right Foot-Anterior (moderate to severe)   Atypical mole 10/08/2019   Right Lower Back (moderate)   Varicose veins    Past Surgical History:  Procedure Laterality Date    ENDOVENOUS ABLATION SAPHENOUS VEIN W/ LASER Right 07-30-2013   endovenous laser ablation right greater saphenous vein and stab phlebectomy 10-20 incisions right leg by Curt Jews MD   ENDOVENOUS ABLATION SAPHENOUS VEIN W/ LASER Left 08-13-2013   EVLA  anterior branch of left greater saphenous vein and stab phlebectomy <10 incisions left leg by Curt Jews MD   Social History   Tobacco Use   Smoking status: Never   Smokeless tobacco: Never  Vaping Use   Vaping Use: Never used  Substance Use Topics   Alcohol use: Yes    Alcohol/week: 2.0 standard drinks of alcohol    Types: 2 Glasses of wine per week   Drug use: No   Social History   Socioeconomic History   Marital status: Single    Spouse name: Not on file   Number of children: Not on file   Years of education: Not on file   Highest education level: Not on file  Occupational History   Not on file  Tobacco Use   Smoking status: Never   Smokeless tobacco: Never  Vaping Use   Vaping Use: Never used  Substance and Sexual Activity   Alcohol use: Yes    Alcohol/week: 2.0 standard drinks of alcohol    Types: 2 Glasses of wine per week   Drug use: No   Sexual activity: Not Currently  Other Topics Concern   Not on file  Social History Narrative   Not on file   Social Determinants of Health   Financial Resource Strain: Not on file  Food Insecurity: Not on file  Transportation Needs: Not on file  Physical Activity: Not on file  Stress: Not on file  Social Connections: Not on file  Intimate Partner Violence: Not on file   Family Status  Relation Name Status   Mother  Alive   Father  Alive   MGM  Deceased   MGF  Deceased   PGM  Alive   PGF  Deceased   Family History  Problem Relation Age of Onset   Varicose Veins Mother    No Known Allergies    Patient Care Team: Kirtis Challis, Connye Burkitt, FNP as PCP - General (Family Medicine) Clark-Burning, Clive, PA-C (Inactive) (Dermatology) Lavonna Monarch, MD (Inactive) as  Consulting Physician (Dermatology)   Outpatient Medications Prior to Visit  Medication Sig   busPIRone (BUSPAR) 7.5 MG tablet TAKE 1 TABLET BY MOUTH THREE TIMES DAILY   Cholecalciferol (VITAMIN D3) 50 MCG (2000 UT) TABS TAKE 1 CAPSULE BY MOUTH ONCE DAILY   famotidine (PEPCID) 20 MG tablet Take 20 mg by mouth 2 (two) times daily.   ibuprofen (ADVIL) 800 MG tablet Take by mouth.   levonorgestrel (MIRENA, 52 MG,) 20 MCG/DAY IUD 1 each by Intrauterine route once.   Melatonin 10 MG TABS Take by mouth.   ondansetron (ZOFRAN) 4 MG tablet Take 1 tablet (4 mg total) by mouth every 8 (eight) hours as needed for nausea or vomiting.   traZODone (DESYREL) 100 MG tablet Take 2 tablets (200 mg total) by mouth at bedtime.   [DISCONTINUED] Eszopiclone 3 MG TABS Take 1 tablet (3 mg total) by mouth at bedtime. Take immediately before bedtime   [DISCONTINUED] MOUNJARO 15 MG/0.5ML Pen Inject 15 mg into the skin once a week.   No facility-administered medications prior to visit.    Review of Systems  Constitutional:  Negative for chills, diaphoresis, fever, malaise/fatigue and weight loss.  HENT:  Positive for ear pain. Negative for congestion, ear discharge, hearing loss, nosebleeds, sinus pain, sore throat and tinnitus.   Eyes: Negative.   Respiratory: Negative.  Negative for stridor.   Cardiovascular: Negative.   Gastrointestinal: Negative.   Genitourinary: Negative.   Musculoskeletal: Negative.   Skin: Negative.   Neurological: Negative.   Endo/Heme/Allergies: Negative.   Psychiatric/Behavioral: Negative.    All other systems reviewed and are negative.         Objective:     BP 118/78   Pulse 73   Temp 98.1 F (36.7 C) (Temporal)   Ht '6\' 1"'$  (1.854 m)   Wt 256 lb 6.4 oz (116.3 kg)   SpO2 98%   BMI 33.83 kg/m  BP Readings from Last 3 Encounters:  06/14/22 118/78  03/14/22 130/83  12/29/21 133/83   Wt Readings from Last 3 Encounters:  06/14/22 256 lb 6.4 oz (116.3 kg)  03/14/22  262 lb (118.8 kg)  12/29/21 278 lb (126.1 kg)      Physical Exam Vitals and nursing note reviewed.  Constitutional:      General: She is not in acute distress.    Appearance: Normal appearance. She is well-developed and well-groomed. She is obese. She is not ill-appearing, toxic-appearing or diaphoretic.  HENT:     Head: Normocephalic and atraumatic.     Jaw: There is normal jaw occlusion.     Right Ear: Hearing normal.  Drainage and tenderness present. Tympanic membrane is not erythematous.     Left Ear: Hearing normal. Drainage and tenderness present. Tympanic membrane is not erythematous.     Ears:     Comments: Erythema to bilateral ear canals    Nose: Nose normal.     Mouth/Throat:     Lips: Pink.     Mouth: Mucous membranes are moist.     Pharynx: Oropharynx is clear. Uvula midline.  Eyes:     General: Lids are normal.     Extraocular Movements: Extraocular movements intact.     Conjunctiva/sclera: Conjunctivae normal.     Pupils: Pupils are equal, round, and reactive to light.  Neck:     Thyroid: No thyroid mass, thyromegaly or thyroid tenderness.     Vascular: No carotid bruit or JVD.     Trachea: Trachea and phonation normal.  Cardiovascular:     Rate and Rhythm: Normal rate and regular rhythm.     Chest Wall: PMI is not displaced.     Pulses: Normal pulses.     Heart sounds: Normal heart sounds. No murmur heard.    No friction rub. No gallop.  Pulmonary:     Effort: Pulmonary effort is normal. No respiratory distress.     Breath sounds: Normal breath sounds. No wheezing.  Chest:     Comments: Deferred, sees GYN Abdominal:     General: Bowel sounds are normal. There is no distension or abdominal bruit.     Palpations: Abdomen is soft. There is no hepatomegaly or splenomegaly.     Tenderness: There is no abdominal tenderness. There is no right CVA tenderness or left CVA tenderness.     Hernia: No hernia is present.  Genitourinary:    Comments: Deferred, sees  GYN Musculoskeletal:        General: Normal range of motion.     Cervical back: Normal range of motion and neck supple.     Right lower leg: No edema.     Left lower leg: No edema.  Lymphadenopathy:     Cervical: No cervical adenopathy.  Skin:    General: Skin is warm and dry.     Capillary Refill: Capillary refill takes less than 2 seconds.     Coloration: Skin is not cyanotic, jaundiced or pale.     Findings: No rash.  Neurological:     General: No focal deficit present.     Mental Status: She is alert and oriented to person, place, and time.     Sensory: Sensation is intact.     Motor: Motor function is intact.     Coordination: Coordination is intact.     Gait: Gait is intact.     Deep Tendon Reflexes: Reflexes are normal and symmetric.  Psychiatric:        Attention and Perception: Attention and perception normal.        Mood and Affect: Mood and affect normal.        Speech: Speech normal.        Behavior: Behavior normal. Behavior is cooperative.        Thought Content: Thought content normal.        Cognition and Memory: Cognition and memory normal.        Judgment: Judgment normal.      Last CBC Lab Results  Component Value Date   WBC 7.1 11/16/2021   HGB 13.9 11/16/2021   HCT 42.1 11/16/2021   MCV 86 11/16/2021   MCH 28.5  11/16/2021   RDW 12.3 11/16/2021   PLT [id number] [id number]   Last metabolic panel Lab Results  Component Value Date   GLUCOSE 90 12/29/2021   NA 137 12/29/2021   K 4.5 12/29/2021   CL 101 12/29/2021   CO2 20 12/29/2021   BUN 10 12/29/2021   CREATININE 0.90 12/29/2021   EGFR 82 12/29/2021   CALCIUM 9.8 12/29/2021   PROT 6.8 12/29/2021   ALBUMIN 4.7 12/29/2021   LABGLOB 2.1 12/29/2021   AGRATIO 2.2 12/29/2021   BILITOT 0.3 12/29/2021   ALKPHOS 67 12/29/2021   AST 21 12/29/2021   ALT 38 (H) 12/29/2021   Last lipids Lab Results  Component Value Date   CHOL 155 11/16/2021   HDL 66 11/16/2021   LDLCALC 68 11/16/2021   TRIG 123  11/16/2021   CHOLHDL 2.3 11/16/2021   Last thyroid functions Lab Results  Component Value Date   TSH 1.340 12/29/2021   T4TOTAL 8.5 12/29/2021   Last vitamin D Lab Results  Component Value Date   VD25OH 37.0 11/16/2021      Assessment & Plan:    Routine Health Maintenance and Physical Exam  Immunization History  Administered Date(s) Administered   Hepatitis B, ADULT 11/06/2012, 01/15/2013, 09/04/2013, 08/01/2015   Hepatitis B, 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   Influenza Split 01/12/2013   Influenza,inj,Quad PF,6+ Mos 01/05/2018, 01/31/2022   Influenza-Unspecified 01/01/2016   Moderna Sars-Covid-2 Vaccination 04/06/2019, 05/05/2019, 03/21/2020    Health Maintenance  Topic Date Due   DTaP/Tdap/Td (1 - Tdap) Never done   PAP SMEAR-Modifier  Never done   COVID-19 Vaccine (4 - 2023-24 season) 06/30/2022 (Originally 12/01/2021)   Hepatitis C Screening  11/17/2022 (Originally 06/18/1998)   HIV Screening  11/17/2022 (Originally 06/18/1995)   INFLUENZA VACCINE  Completed   HPV VACCINES  Aged Out    Discussed health benefits of physical activity, and encouraged her to engage in regular exercise appropriate for her age and condition.  Problem List Items Addressed This Visit       Other   BMI 38.0-38.9,adult   Relevant Medications   MOUNJARO 15 MG/0.5ML Pen   Other Relevant Orders   CMP14+EGFR   CBC with Differential/Platelet   Lipid panel   Thyroid Panel With TSH   VITAMIN D 25 Hydroxy (Vit-D Deficiency, Fractures)   Sleep disturbance   Relevant Medications   Eszopiclone 3 MG TABS   Other Relevant Orders   ToxASSURE Select 13 (MW), Urine   Thyroid Panel With TSH   History of vitamin D deficiency   Relevant Orders   VITAMIN D 25 Hydroxy (Vit-D Deficiency, Fractures)   Controlled substance agreement signed   Relevant Medications   Eszopiclone 3 MG TABS   Other Relevant Orders   ToxASSURE Select 13 (MW), Urine   Other Visit Diagnoses     Annual physical exam    -  Primary    Relevant Orders   CMP14+EGFR   CBC with Differential/Platelet   Lipid panel   Thyroid Panel With TSH   VITAMIN D 25 Hydroxy (Vit-D Deficiency, Fractures)   Screening for lipid disorders       Relevant Orders   Lipid panel   Screening for deficiency anemia       Relevant Orders   CBC with Differential/Platelet   Screening for endocrine disorder       Relevant Orders   CMP14+EGFR   Thyroid Panel With TSH   Encounter for weight loss counseling       Relevant Medications   MOUNJARO  15 MG/0.5ML Pen   Acute diffuse otitis externa of both ears       Relevant Medications   ciprofloxacin-dexamethasone (CIPRODEX) OTIC suspension      Return in about 6 months (around 12/15/2022), or if symptoms worsen or fail to improve, for Insomnia.   The above assessment and management plan was discussed with the patient. The patient verbalized understanding of and has agreed to the management plan. Patient is aware to call the clinic if they develop any new symptoms or if symptoms fail to improve or worsen. Patient is aware when to return to the clinic for a follow-up visit. Patient educated on when it is appropriate to go to the emergency department.    Monia Pouch, FNP-C [facility] [address] [place], [place] [address] [phone]

## 2022-06-15 LAB — CBC WITH DIFFERENTIAL/PLATELET
Basophils Absolute: 0 10*3/uL (ref 0.0–0.2)
Basos: 1 %
EOS (ABSOLUTE): 0.1 10*3/uL (ref 0.0–0.4)
Eos: 1 %
Hematocrit: 40.8 % (ref 34.0–46.6)
Hemoglobin: 13.1 g/dL (ref 11.1–15.9)
Immature Grans (Abs): 0 10*3/uL (ref 0.0–0.1)
Immature Granulocytes: 0 %
Lymphocytes Absolute: 2.1 10*3/uL (ref 0.7–3.1)
Lymphs: 39 %
MCH: 28.6 pg (ref 26.6–33.0)
MCHC: 32.1 g/dL (ref 31.5–35.7)
MCV: 89 fL (ref 79–97)
Monocytes Absolute: 0.4 10*3/uL (ref 0.1–0.9)
Monocytes: 8 %
Neutrophils Absolute: 2.8 10*3/uL (ref 1.4–7.0)
Neutrophils: 51 %
Platelets: 245 10*3/uL (ref 150–450)
RBC: 4.58 x10E6/uL (ref 3.77–5.28)
RDW: 12.5 % (ref 11.7–15.4)
WBC: 5.4 10*3/uL (ref 3.4–10.8)

## 2022-06-15 LAB — LIPID PANEL
Chol/HDL Ratio: 2.4 ratio (ref 0.0–4.4)
Cholesterol, Total: 148 mg/dL (ref 100–199)
HDL: 61 mg/dL (ref >39)
LDL Chol Calc (NIH): 74 mg/dL (ref 0–99)
Triglycerides: 62 mg/dL (ref 0–149)
VLDL Cholesterol Cal: 13 mg/dL (ref 5–40)

## 2022-06-15 LAB — THYROID PANEL WITH TSH
Free Thyroxine Index: 2.7 (ref 1.2–4.9)
T3 Uptake Ratio: 33 % (ref 24–39)
T4, Total: 8.3 ug/dL (ref 4.5–12.0)
TSH: 1.41 u[IU]/mL (ref 0.450–4.500)

## 2022-06-15 LAB — CMP14+EGFR
ALT: 21 IU/L (ref 0–32)
AST: 20 IU/L (ref 0–40)
Albumin/Globulin Ratio: 2.1 (ref 1.2–2.2)
Albumin: 4.4 g/dL (ref 3.9–4.9)
Alkaline Phosphatase: 67 IU/L (ref 44–121)
BUN/Creatinine Ratio: 13 (ref 9–23)
BUN: 11 mg/dL (ref 6–24)
Bilirubin Total: 0.4 mg/dL (ref 0.0–1.2)
CO2: 20 mmol/L (ref 20–29)
Calcium: 9.7 mg/dL (ref 8.7–10.2)
Chloride: 104 mmol/L (ref 96–106)
Creatinine, Ser: 0.88 mg/dL (ref 0.57–1.00)
Globulin, Total: 2.1 g/dL (ref 1.5–4.5)
Glucose: 84 mg/dL (ref 70–99)
Potassium: 4.5 mmol/L (ref 3.5–5.2)
Sodium: 138 mmol/L (ref 134–144)
Total Protein: 6.5 g/dL (ref 6.0–8.5)
eGFR: 85 mL/min/{1.73_m2} (ref >59)

## 2022-06-15 LAB — VITAMIN D 25 HYDROXY (VIT D DEFICIENCY, FRACTURES): Vit D, 25-Hydroxy: 38.2 ng/mL (ref 30.0–100.0)

## 2022-06-19 LAB — TOXASSURE SELECT 13 (MW), URINE

## 2022-06-25 ENCOUNTER — Other Ambulatory Visit: Payer: Self-pay | Admitting: Family Medicine

## 2022-06-25 DIAGNOSIS — F411 Generalized anxiety disorder: Secondary | ICD-10-CM

## 2022-07-16 ENCOUNTER — Encounter: Payer: Self-pay | Admitting: Nurse Practitioner

## 2022-08-09 DIAGNOSIS — Z30431 Encounter for routine checking of intrauterine contraceptive device: Secondary | ICD-10-CM | POA: Diagnosis not present

## 2022-08-09 DIAGNOSIS — N939 Abnormal uterine and vaginal bleeding, unspecified: Secondary | ICD-10-CM | POA: Diagnosis not present

## 2022-08-30 ENCOUNTER — Other Ambulatory Visit: Payer: Self-pay | Admitting: Family Medicine

## 2022-08-30 DIAGNOSIS — H60313 Diffuse otitis externa, bilateral: Secondary | ICD-10-CM

## 2022-09-28 ENCOUNTER — Ambulatory Visit: Payer: BC Managed Care – PPO | Admitting: Nurse Practitioner

## 2022-10-01 ENCOUNTER — Other Ambulatory Visit: Payer: Self-pay | Admitting: Family Medicine

## 2022-10-01 DIAGNOSIS — F411 Generalized anxiety disorder: Secondary | ICD-10-CM

## 2022-10-13 ENCOUNTER — Telehealth: Payer: BC Managed Care – PPO | Admitting: Family Medicine

## 2022-10-13 DIAGNOSIS — B9689 Other specified bacterial agents as the cause of diseases classified elsewhere: Secondary | ICD-10-CM

## 2022-10-13 DIAGNOSIS — J019 Acute sinusitis, unspecified: Secondary | ICD-10-CM | POA: Diagnosis not present

## 2022-10-13 MED ORDER — AMOXICILLIN-POT CLAVULANATE 875-125 MG PO TABS: 1.0000 | ORAL_TABLET | Freq: Two times a day (BID) | ORAL | 0 refills | Status: DC

## 2022-10-13 NOTE — Progress Notes (Signed)

## 2022-11-26 ENCOUNTER — Other Ambulatory Visit: Payer: Self-pay | Admitting: Family Medicine

## 2022-11-26 DIAGNOSIS — G479 Sleep disorder, unspecified: Secondary | ICD-10-CM

## 2022-12-14 ENCOUNTER — Ambulatory Visit: Payer: BC Managed Care – PPO | Admitting: Family Medicine

## 2022-12-14 ENCOUNTER — Encounter: Payer: Self-pay | Admitting: Family Medicine

## 2022-12-14 VITALS — BP 119/75 | HR 72 | Temp 98.0°F | Ht 73.0 in | Wt 244.2 lb

## 2022-12-14 DIAGNOSIS — K219 Gastro-esophageal reflux disease without esophagitis: Secondary | ICD-10-CM

## 2022-12-14 DIAGNOSIS — Z6838 Body mass index (BMI) 38.0-38.9, adult: Secondary | ICD-10-CM

## 2022-12-14 DIAGNOSIS — M62830 Muscle spasm of back: Secondary | ICD-10-CM | POA: Diagnosis not present

## 2022-12-14 DIAGNOSIS — Z79899 Other long term (current) drug therapy: Secondary | ICD-10-CM

## 2022-12-14 DIAGNOSIS — G479 Sleep disorder, unspecified: Secondary | ICD-10-CM | POA: Diagnosis not present

## 2022-12-14 DIAGNOSIS — Z713 Dietary counseling and surveillance: Secondary | ICD-10-CM

## 2022-12-14 MED ORDER — MOUNJARO 15 MG/0.5ML ~~LOC~~ SOAJ
15.0000 mg | SUBCUTANEOUS | 3 refills | Status: DC
Start: 2022-12-14 — End: 2023-06-13

## 2022-12-14 MED ORDER — ESZOPICLONE 3 MG PO TABS: 3.0000 mg | ORAL_TABLET | Freq: Every day | ORAL | 5 refills | Status: DC

## 2022-12-14 MED ORDER — FAMOTIDINE 20 MG PO TABS
20.0000 mg | ORAL_TABLET | Freq: Two times a day (BID) | ORAL | 1 refills | Status: DC
Start: 2022-12-14 — End: 2023-10-10

## 2022-12-14 MED ORDER — METHYLPREDNISOLONE ACETATE 40 MG/ML IJ SUSP
40.0000 mg | Freq: Once | INTRAMUSCULAR | Status: AC
Start: 2022-12-14 — End: 2022-12-14
  Administered 2022-12-14: 60 mg via INTRAMUSCULAR

## 2022-12-14 MED ORDER — CYCLOBENZAPRINE HCL 10 MG PO TABS
10.0000 mg | ORAL_TABLET | Freq: Three times a day (TID) | ORAL | 0 refills | Status: DC | PRN
Start: 2022-12-14 — End: 2023-01-30

## 2022-12-14 NOTE — Progress Notes (Signed)
Subjective:  Patient ID: Latasha Holmes, female    DOB: Mar 03, 1981, 42 y.o.   MRN: [id number]  Patient Care Team: Sonny Masters, FNP as PCP - General (Family Medicine) Myer Peer, Victorino Dike, PA-C (Inactive) (Dermatology) Janalyn Harder, MD (Inactive) as Consulting Physician (Dermatology)   Chief Complaint:  Insomnia (6 month follow up /)   HPI: Latasha Holmes is a 42 y.o. female presenting on 12/14/2022 for Insomnia (6 month follow up /)    1. Sleep disturbance 2. Controlled substance agreement signed Hs been alternating trazodone and Lunesta which has been working very well for her. She denies daytime fatigue. No adverse side effects from medications. UDS and CSA are current. PDMP reviewed, no red flags.   3. BMI 38.0-38.9,adult 4. Encounter for weight loss counseling Currently on Mounjaro 15 mg weekly and tolerating well. She has been working out on a regular basis and is following a more healthy diet. Has lost from 262 lbs to 244 lbs to date.  Wt Readings from Last 3 Encounters:  12/14/22 244 lb 3.2 oz (110.8 kg)  06/14/22 256 lb 6.4 oz (116.3 kg)  03/14/22 262 lb (118.8 kg)    5. Gastroesophageal reflux disease without esophagitis Compliant with medications - Yes Current medications - pepcid Adverse side effects - No Cough - No Sore throat - No Voice change - No Hemoptysis - No Dysphagia or dyspepsia - No Water brash - No Red Flags (weight loss, hematochezia, melena, weight loss, early satiety, fevers, odynophagia, or persistent vomiting) - No   6. Back pain Pt reports mid back pain for the last 1-2 weeks. States the pain is right sided and hurts worse with certain movements. No loss of function. No numbness or tingling. No unexplained weight loss, fever, chills, loss of appetite, night sweats, or weakness. Has tried topicals and tylenol with minimal relief of symptoms.      Relevant past medical, surgical, family, and social history reviewed and  updated as indicated.  Allergies and medications reviewed and updated. Data reviewed: Chart in Epic.   Past Medical History:  Diagnosis Date   Atypical mole 10/08/2019   Right Foot-Anterior (moderate to severe)   Atypical mole 10/08/2019   Right Lower Back (moderate)   Varicose veins     Past Surgical History:  Procedure Laterality Date   ENDOVENOUS ABLATION SAPHENOUS VEIN W/ LASER Right 07-30-2013   endovenous laser ablation right greater saphenous vein and stab phlebectomy 10-20 incisions right leg by Gretta Began MD   ENDOVENOUS ABLATION SAPHENOUS VEIN W/ LASER Left 08-13-2013   EVLA  anterior branch of left greater saphenous vein and stab phlebectomy <10 incisions left leg by Gretta Began MD    Social History   Socioeconomic History   Marital status: Single    Spouse name: Not on file   Number of children: Not on file   Years of education: Not on file   Highest education level: Not on file  Occupational History   Not on file  Tobacco Use   Smoking status: Never   Smokeless tobacco: Never  Vaping Use   Vaping status: Never Used  Substance and Sexual Activity   Alcohol use: Yes    Alcohol/week: 2.0 standard drinks of alcohol    Types: 2 Glasses of wine per week   Drug use: No   Sexual activity: Not Currently  Other Topics Concern   Not on file  Social History Narrative   Not on file   Social Determinants of  Health   Financial Resource Strain: Not on file  Food Insecurity: Not on file  Transportation Needs: Not on file  Physical Activity: Not on file  Stress: Not on file  Social Connections: Not on file  Intimate Partner Violence: Not on file    Outpatient Encounter Medications as of 12/14/2022  Medication Sig   cyclobenzaprine (FLEXERIL) 10 MG tablet Take 1 tablet (10 mg total) by mouth 3 (three) times daily as needed for muscle spasms.   busPIRone (BUSPAR) 7.5 MG tablet TAKE 1 TABLET BY MOUTH THREE TIMES DAILY   Cholecalciferol (VITAMIN D3) 50 MCG (2000 UT)  TABS TAKE 1 CAPSULE BY MOUTH ONCE DAILY   ciprofloxacin-dexamethasone (CIPRODEX) OTIC suspension Place 4 drops into both ears 2 (two) times daily. (Patient not taking: Reported on 12/14/2022)   Eszopiclone 3 MG TABS Take 1 tablet (3 mg total) by mouth at bedtime. Take immediately before bedtime   famotidine (PEPCID) 20 MG tablet Take 1 tablet (20 mg total) by mouth 2 (two) times daily.   ibuprofen (ADVIL) 800 MG tablet Take by mouth.   levonorgestrel (MIRENA, 52 MG,) 20 MCG/DAY IUD 1 each by Intrauterine route once.   Melatonin 10 MG TABS Take by mouth.   MOUNJARO 15 MG/0.5ML Pen Inject 15 mg into the skin once a week.   ondansetron (ZOFRAN) 4 MG tablet Take 1 tablet (4 mg total) by mouth every 8 (eight) hours as needed for nausea or vomiting.   traZODone (DESYREL) 100 MG tablet TAKE 2 TABLETS BY MOUTH AT BEDTIME   [DISCONTINUED] amoxicillin-clavulanate (AUGMENTIN) 875-125 MG tablet Take 1 tablet by mouth 2 (two) times daily.   [DISCONTINUED] Eszopiclone 3 MG TABS Take 1 tablet (3 mg total) by mouth at bedtime. Take immediately before bedtime   [DISCONTINUED] famotidine (PEPCID) 20 MG tablet Take 20 mg by mouth 2 (two) times daily.   [DISCONTINUED] MOUNJARO 15 MG/0.5ML Pen Inject 15 mg into the skin once a week.   [EXPIRED] methylPREDNISolone acetate (DEPO-MEDROL) injection 40 mg    No facility-administered encounter medications on file as of 12/14/2022.    No Known Allergies  Review of Systems  Constitutional:  Negative for activity change, appetite change, chills, diaphoresis, fatigue, fever and unexpected weight change.  HENT: Negative.    Eyes: Negative.  Negative for photophobia and visual disturbance.  Respiratory:  Negative for cough, chest tightness and shortness of breath.   Cardiovascular:  Negative for chest pain, palpitations and leg swelling.  Gastrointestinal:  Negative for abdominal pain, blood in stool, constipation, diarrhea, nausea and vomiting.  Endocrine: Negative.   Negative for cold intolerance, heat intolerance, polydipsia, polyphagia and polyuria.  Genitourinary:  Negative for decreased urine volume, difficulty urinating, dysuria, frequency and urgency.  Musculoskeletal:  Positive for back pain and myalgias. Negative for arthralgias, gait problem, joint swelling, neck pain and neck stiffness.  Skin: Negative.   Allergic/Immunologic: Negative.   Neurological:  Negative for dizziness, tremors, seizures, syncope, facial asymmetry, speech difficulty, weakness, light-headedness, numbness and headaches.  Hematological: Negative.   Psychiatric/Behavioral:  Positive for sleep disturbance. Negative for confusion, hallucinations and suicidal ideas.   All other systems reviewed and are negative.       Objective:  BP 119/75   Pulse 72   Temp 98 F (36.7 C) (Temporal)   Ht 6\' 1"  (1.854 m)   Wt 244 lb 3.2 oz (110.8 kg)   SpO2 98%   BMI 32.22 kg/m    Wt Readings from Last 3 Encounters:  12/14/22 244 lb 3.2  oz (110.8 kg)  06/14/22 256 lb 6.4 oz (116.3 kg)  03/14/22 262 lb (118.8 kg)    Physical Exam Vitals and nursing note reviewed.  Constitutional:      General: She is not in acute distress.    Appearance: Normal appearance. She is obese. She is not ill-appearing, toxic-appearing or diaphoretic.  HENT:     Head: Normocephalic and atraumatic.     Right Ear: Hearing and tympanic membrane normal.     Left Ear: Hearing and tympanic membrane normal.     Ears:     Comments: Flaking of skin in ear canal, no erythema or drainage    Nose: Nose normal.     Mouth/Throat:     Mouth: Mucous membranes are moist.  Eyes:     Pupils: Pupils are equal, round, and reactive to light.  Cardiovascular:     Rate and Rhythm: Normal rate and regular rhythm.     Heart sounds: Normal heart sounds.  Pulmonary:     Effort: Pulmonary effort is normal.     Breath sounds: Normal breath sounds.  Musculoskeletal:     Cervical back: Normal.     Thoracic back: Spasms and  tenderness present. No swelling, edema, deformity, signs of trauma, lacerations or bony tenderness. Normal range of motion. No scoliosis.     Lumbar back: Normal.       Back:     Right lower leg: No edema.     Left lower leg: No edema.  Skin:    General: Skin is warm and dry.     Capillary Refill: Capillary refill takes less than 2 seconds.  Neurological:     General: No focal deficit present.     Mental Status: She is alert and oriented to person, place, and time.  Psychiatric:        Mood and Affect: Mood normal.        Behavior: Behavior normal.        Thought Content: Thought content normal.        Judgment: Judgment normal.     Results for orders placed or performed in visit on 06/14/22  ToxASSURE Select 13 (MW), Urine  Result Value Ref Range   Summary Note   CMP14+EGFR  Result Value Ref Range   Glucose 84 70 - 99 mg/dL   BUN 11 6 - 24 mg/dL   Creatinine, Ser 1.61 0.57 - 1.00 mg/dL   eGFR 85 >09 UE/AVW/0.98   BUN/Creatinine Ratio 13 9 - 23   Sodium 138 134 - 144 mmol/L   Potassium 4.5 3.5 - 5.2 mmol/L   Chloride 104 96 - 106 mmol/L   CO2 20 20 - 29 mmol/L   Calcium 9.7 8.7 - 10.2 mg/dL   Total Protein 6.5 6.0 - 8.5 g/dL   Albumin 4.4 3.9 - 4.9 g/dL   Globulin, Total 2.1 1.5 - 4.5 g/dL   Albumin/Globulin Ratio 2.1 1.2 - 2.2   Bilirubin Total 0.4 0.0 - 1.2 mg/dL   Alkaline Phosphatase 67 44 - 121 IU/L   AST 20 0 - 40 IU/L   ALT 21 0 - 32 IU/L  CBC with Differential/Platelet  Result Value Ref Range   WBC 5.4 3.4 - 10.8 x10E3/uL   RBC 4.58 3.77 - 5.28 x10E6/uL   Hemoglobin 13.1 11.1 - 15.9 g/dL   Hematocrit 11.9 14.7 - 46.6 %   MCV 89 79 - 97 fL   MCH 28.6 26.6 - 33.0 pg   MCHC 32.1 31.5 -  35.7 g/dL   RDW 16.1 09.6 - 04.5 %   Platelets 245 150 - 450 x10E3/uL   Neutrophils 51 Not Estab. %   Lymphs 39 Not Estab. %   Monocytes 8 Not Estab. %   Eos 1 Not Estab. %   Basos 1 Not Estab. %   Neutrophils Absolute 2.8 1.4 - 7.0 x10E3/uL   Lymphocytes Absolute 2.1  0.7 - 3.1 x10E3/uL   Monocytes Absolute 0.4 0.1 - 0.9 x10E3/uL   EOS (ABSOLUTE) 0.1 0.0 - 0.4 x10E3/uL   Basophils Absolute 0.0 0.0 - 0.2 x10E3/uL   Immature Granulocytes 0 Not Estab. %   Immature Grans (Abs) 0.0 0.0 - 0.1 x10E3/uL  Lipid panel  Result Value Ref Range   Cholesterol, Total 148 100 - 199 mg/dL   Triglycerides 62 0 - 149 mg/dL   HDL 61 >40 mg/dL   VLDL Cholesterol Cal 13 5 - 40 mg/dL   LDL Chol Calc (NIH) 74 0 - 99 mg/dL   Chol/HDL Ratio 2.4 0.0 - 4.4 ratio  Thyroid Panel With TSH  Result Value Ref Range   TSH 1.410 0.450 - 4.500 uIU/mL   T4, Total 8.3 4.5 - 12.0 ug/dL   T3 Uptake Ratio 33 24 - 39 %   Free Thyroxine Index 2.7 1.2 - 4.9  VITAMIN D 25 Hydroxy (Vit-D Deficiency, Fractures)  Result Value Ref Range   Vit D, 25-Hydroxy 38.2 30.0 - 100.0 ng/mL       Pertinent labs & imaging results that were available during my care of the patient were reviewed by me and considered in my medical decision making.  Assessment & Plan:  Latasha "Florentina Addison" was seen today for insomnia.  Diagnoses and all orders for this visit:  Gastroesophageal reflux disease without esophagitis No red flags present. Diet discussed. Avoid fried, spicy, fatty, greasy, and acidic foods. Avoid caffeine, nicotine, and alcohol. Do not eat 2-3 hours before bedtime and stay upright for at least 1-2 hours after eating. Eat small frequent meals. Avoid NSAID's like motrin and aleve. Medications as prescribed. Report any new or worsening symptoms. Follow up as discussed or sooner if needed.   -     famotidine (PEPCID) 20 MG tablet; Take 1 tablet (20 mg total) by mouth 2 (two) times daily. -     CBC with Differential/Platelet  Sleep disturbance Controlled substance agreement signed Doing well on below, will continue.  -     Eszopiclone 3 MG TABS; Take 1 tablet (3 mg total) by mouth at bedtime. Take immediately before bedtime  BMI 38.0-38.9,adult Encounter for weight loss counseling Doing well with  medications, diet and exercise. Will continue.  -     MOUNJARO 15 MG/0.5ML Pen; Inject 15 mg into the skin once a week. -     CMP14+EGFR -     CBC with Differential/Platelet -     Thyroid Panel With TSH  Muscle spasm of back Palpated spasm of Paraspinous muscle. Burst of steroids today. Flexeril as needed for spasm, sedation precautions provided. Report new, worsening, or persistent symptoms.  -     cyclobenzaprine (FLEXERIL) 10 MG tablet; Take 1 tablet (10 mg total) by mouth 3 (three) times daily as needed for muscle spasms. -     methylPREDNISolone acetate (DEPO-MEDROL) injection 40 mg     Continue all other maintenance medications.  Follow up plan: Return in about 6 months (around 06/13/2023), or if symptoms worsen or fail to improve, for weight management .   Continue healthy lifestyle  choices, including diet (rich in fruits, vegetables, and lean proteins, and low in salt and simple carbohydrates) and exercise (at least 30 minutes of moderate physical activity daily).   The above assessment and management plan was discussed with the patient. The patient verbalized understanding of and has agreed to the management plan. Patient is aware to call the clinic if they develop any new symptoms or if symptoms persist or worsen. Patient is aware when to return to the clinic for a follow-up visit. Patient educated on when it is appropriate to go to the emergency department.   Kari Baars, FNP-C [facility] [phone]

## 2022-12-15 LAB — CBC WITH DIFFERENTIAL/PLATELET
Basophils Absolute: 0 10*3/uL (ref 0.0–0.2)
Basos: 0 %
EOS (ABSOLUTE): 0.1 10*3/uL (ref 0.0–0.4)
Eos: 1 %
Hematocrit: 42.7 % (ref 34.0–46.6)
Hemoglobin: 13.9 g/dL (ref 11.1–15.9)
Immature Grans (Abs): 0 10*3/uL (ref 0.0–0.1)
Immature Granulocytes: 0 %
Lymphocytes Absolute: 2.5 10*3/uL (ref 0.7–3.1)
Lymphs: 42 %
MCH: 29.1 pg (ref 26.6–33.0)
MCHC: 32.6 g/dL (ref 31.5–35.7)
MCV: 89 fL (ref 79–97)
Monocytes Absolute: 0.5 10*3/uL (ref 0.1–0.9)
Monocytes: 8 %
Neutrophils Absolute: 2.9 10*3/uL (ref 1.4–7.0)
Neutrophils: 49 %
Platelets: 245 10*3/uL (ref 150–450)
RBC: 4.78 x10E6/uL (ref 3.77–5.28)
RDW: 12.1 % (ref 11.7–15.4)
WBC: 5.9 10*3/uL (ref 3.4–10.8)

## 2022-12-15 LAB — CMP14+EGFR
ALT: 25 IU/L (ref 0–32)
AST: 18 IU/L (ref 0–40)
Albumin: 4.4 g/dL (ref 3.9–4.9)
Alkaline Phosphatase: 61 IU/L (ref 44–121)
BUN/Creatinine Ratio: 10 (ref 9–23)
BUN: 9 mg/dL (ref 6–24)
Bilirubin Total: 0.5 mg/dL (ref 0.0–1.2)
CO2: 19 mmol/L — ABNORMAL LOW (ref 20–29)
Calcium: 9.5 mg/dL (ref 8.7–10.2)
Chloride: 103 mmol/L (ref 96–106)
Creatinine, Ser: 0.9 mg/dL (ref 0.57–1.00)
Globulin, Total: 2.1 g/dL (ref 1.5–4.5)
Glucose: 78 mg/dL (ref 70–99)
Potassium: 4.6 mmol/L (ref 3.5–5.2)
Sodium: 139 mmol/L (ref 134–144)
Total Protein: 6.5 g/dL (ref 6.0–8.5)
eGFR: 82 mL/min/{1.73_m2} (ref >59)

## 2022-12-15 LAB — THYROID PANEL WITH TSH
Free Thyroxine Index: 2.5 (ref 1.2–4.9)
T3 Uptake Ratio: 29 % (ref 24–39)
T4, Total: 8.5 ug/dL (ref 4.5–12.0)
TSH: 1.18 u[IU]/mL (ref 0.450–4.500)

## 2023-01-02 DIAGNOSIS — H5213 Myopia, bilateral: Secondary | ICD-10-CM | POA: Diagnosis not present

## 2023-01-02 DIAGNOSIS — H04123 Dry eye syndrome of bilateral lacrimal glands: Secondary | ICD-10-CM | POA: Diagnosis not present

## 2023-01-10 DIAGNOSIS — M79662 Pain in left lower leg: Secondary | ICD-10-CM | POA: Diagnosis not present

## 2023-01-11 ENCOUNTER — Telehealth: Payer: Self-pay | Admitting: Family Medicine

## 2023-01-11 ENCOUNTER — Ambulatory Visit: Payer: BC Managed Care – PPO | Admitting: Family Medicine

## 2023-01-11 ENCOUNTER — Encounter: Payer: Self-pay | Admitting: Family Medicine

## 2023-01-11 ENCOUNTER — Ambulatory Visit (HOSPITAL_COMMUNITY)
Admission: RE | Admit: 2023-01-11 | Discharge: 2023-01-11 | Disposition: A | Discharge status: Home / Self Care | Payer: BC Managed Care – PPO | Source: Ambulatory Visit | Attending: Family Medicine | Admitting: Family Medicine

## 2023-01-11 VITALS — BP 128/82 | HR 72 | Temp 97.8°F | Ht 73.0 in | Wt 242.4 lb

## 2023-01-11 DIAGNOSIS — M79662 Pain in left lower leg: Secondary | ICD-10-CM | POA: Diagnosis not present

## 2023-01-11 DIAGNOSIS — R937 Abnormal findings on diagnostic imaging of other parts of musculoskeletal system: Secondary | ICD-10-CM | POA: Diagnosis not present

## 2023-01-11 MED ORDER — PREDNISONE 20 MG PO TABS
40.0000 mg | ORAL_TABLET | Freq: Every day | ORAL | 0 refills | Status: AC
Start: 2023-01-11 — End: 2023-01-16

## 2023-01-11 NOTE — Progress Notes (Signed)
Subjective:  Patient ID: Latasha Holmes, female    DOB: 06-14-1980, 42 y.o.   MRN: [id number]  Patient Care Team: Sonny Masters, FNP as PCP - General (Family Medicine) Derenda Mis, PA-C (Inactive) (Dermatology) Janalyn Harder, MD (Inactive) as Consulting Physician (Dermatology)   Chief Complaint:  left calf pain  (01/10/2023/[facility] )   HPI: Latasha Holmes is a 42 y.o. female presenting on 01/11/2023 for left calf pain  (01/10/2023/[facility] )   Discussed the use of AI scribe software for clinical note transcription with the patient, who gave verbal consent to proceed.  History of Present Illness   The patient, with an unspecified medical history, presented with a sudden onset of lower extremity pain. While standing at her work terminal, Latasha Holmes experienced a 'pop' sensation in her leg, which has been causing persistent discomfort. The pain, initially localized to the upper part of the leg, has since radiated downwards. The patient reported that dorsiflexion of the foot exacerbates the pain. He also noted tenderness along the leg, particularly in the lower region.  The patient denied any recent injuries, long travels, changes in medication, or surgical procedures. Latasha Holmes also denied experiencing any shortness of breath, chest pain, or palpitations. For pain management, Latasha Holmes has been taking 800mg  of ibuprofen, which provided minimal relief. Latasha Holmes also considered the possibility of needing another prednisone shot, as Latasha Holmes previously had one for back pain.  The patient expressed concern about the 'pop' sensation, which Latasha Holmes had never experienced before, and the tenderness along the Achilles tendon. Latasha Holmes wondered if a partial tear or rupture could be a possibility.          Relevant past medical, surgical, family, and social history reviewed and updated as indicated.  Allergies and medications reviewed and updated. Data  reviewed: Chart in Epic.   Past Medical History:  Diagnosis Date   Atypical mole 10/08/2019   Right Foot-Anterior (moderate to severe)   Atypical mole 10/08/2019   Right Lower Back (moderate)   Varicose veins     Past Surgical History:  Procedure Laterality Date   ENDOVENOUS ABLATION SAPHENOUS VEIN W/ LASER Right 07-30-2013   endovenous laser ablation right greater saphenous vein and stab phlebectomy 10-20 incisions right leg by Gretta Began MD   ENDOVENOUS ABLATION SAPHENOUS VEIN W/ LASER Left 08-13-2013   EVLA  anterior branch of left greater saphenous vein and stab phlebectomy <10 incisions left leg by Gretta Began MD    Social History   Socioeconomic History   Marital status: Single    Spouse name: Not on file   Number of children: Not on file   Years of education: Not on file   Highest education level: Not on file  Occupational History   Not on file  Tobacco Use   Smoking status: Never   Smokeless tobacco: Never  Vaping Use   Vaping status: Never Used  Substance and Sexual Activity   Alcohol use: Yes    Alcohol/week: 2.0 standard drinks of alcohol    Types: 2 Glasses of wine per week   Drug use: No   Sexual activity: Not Currently  Other Topics Concern   Not on file  Social History Narrative   Not on file   Social Determinants of Health   Financial Resource Strain: Not on file  Food Insecurity: Not on file  Transportation Needs: Not on file  Physical Activity: Not on file  Stress: Not on  file  Social Connections: Not on file  Intimate Partner Violence: Not on file    Outpatient Encounter Medications as of 01/11/2023  Medication Sig   busPIRone (BUSPAR) 7.5 MG tablet TAKE 1 TABLET BY MOUTH THREE TIMES DAILY   Cholecalciferol (VITAMIN D3) 50 MCG (2000 UT) TABS TAKE 1 CAPSULE BY MOUTH ONCE DAILY   ciprofloxacin-dexamethasone (CIPRODEX) OTIC suspension Place 4 drops into both ears 2 (two) times daily.   cyclobenzaprine (FLEXERIL) 10 MG tablet Take 1 tablet  (10 mg total) by mouth 3 (three) times daily as needed for muscle spasms.   Eszopiclone 3 MG TABS Take 1 tablet (3 mg total) by mouth at bedtime. Take immediately before bedtime   famotidine (PEPCID) 20 MG tablet Take 1 tablet (20 mg total) by mouth 2 (two) times daily.   ibuprofen (ADVIL) 800 MG tablet Take by mouth.   levonorgestrel (MIRENA, 52 MG,) 20 MCG/DAY IUD 1 each by Intrauterine route once.   Melatonin 10 MG TABS Take by mouth.   MOUNJARO 15 MG/0.5ML Pen Inject 15 mg into the skin once a week.   ondansetron (ZOFRAN) 4 MG tablet Take 1 tablet (4 mg total) by mouth every 8 (eight) hours as needed for nausea or vomiting.   traZODone (DESYREL) 100 MG tablet TAKE 2 TABLETS BY MOUTH AT BEDTIME   No facility-administered encounter medications on file as of 01/11/2023.    No Known Allergies  Pertinent ROS per HPI, otherwise unremarkable      Objective:  BP 128/82   Pulse 72   Temp 97.8 F (36.6 C) (Temporal)   Ht 6\' 1"  (1.854 m)   Wt 242 lb 6.4 oz (110 kg)   SpO2 99%   BMI 31.98 kg/m    Wt Readings from Last 3 Encounters:  01/11/23 242 lb 6.4 oz (110 kg)  12/14/22 244 lb 3.2 oz (110.8 kg)  06/14/22 256 lb 6.4 oz (116.3 kg)    Physical Exam Vitals and nursing note reviewed.  Constitutional:      General: Latasha Holmes is not in acute distress.    Appearance: Normal appearance. Latasha Holmes is obese. Latasha Holmes is not ill-appearing, toxic-appearing or diaphoretic.  HENT:     Head: Normocephalic and atraumatic.     Nose: Nose normal.     Mouth/Throat:     Mouth: Mucous membranes are moist.  Eyes:     Pupils: Pupils are equal, round, and reactive to light.  Cardiovascular:     Rate and Rhythm: Normal rate and regular rhythm.     Heart sounds: Normal heart sounds.  Pulmonary:     Effort: Pulmonary effort is normal.     Breath sounds: Normal breath sounds.  Skin:    General: Skin is warm and dry.     Capillary Refill: Capillary refill takes less than 2 seconds.  Neurological:     General:  No focal deficit present.     Mental Status: Latasha Holmes is alert and oriented to person, place, and time.  Psychiatric:        Mood and Affect: Mood normal.        Behavior: Behavior normal.        Thought Content: Thought content normal.        Judgment: Judgment normal.    Physical Exam   MUSCULOSKELETAL: Pain upon palpation and dorsiflexion of the foot. Positive Thompson squeeze test indicating possible Achilles tendon involvement. Negative Homan's test ruling out deep vein thrombosis. Tenderness along the Achilles tendon.  Results for orders placed or performed in visit on 12/14/22  CMP14+EGFR  Result Value Ref Range   Glucose 78 70 - 99 mg/dL   BUN 9 6 - 24 mg/dL   Creatinine, Ser 8.29 0.57 - 1.00 mg/dL   eGFR 82 >56 OZ/HYQ/6.57   BUN/Creatinine Ratio 10 9 - 23   Sodium 139 134 - 144 mmol/L   Potassium 4.6 3.5 - 5.2 mmol/L   Chloride 103 96 - 106 mmol/L   CO2 19 (L) 20 - 29 mmol/L   Calcium 9.5 8.7 - 10.2 mg/dL   Total Protein 6.5 6.0 - 8.5 g/dL   Albumin 4.4 3.9 - 4.9 g/dL   Globulin, Total 2.1 1.5 - 4.5 g/dL   Bilirubin Total 0.5 0.0 - 1.2 mg/dL   Alkaline Phosphatase 61 44 - 121 IU/L   AST 18 0 - 40 IU/L   ALT 25 0 - 32 IU/L  CBC with Differential/Platelet  Result Value Ref Range   WBC 5.9 3.4 - 10.8 x10E3/uL   RBC 4.78 3.77 - 5.28 x10E6/uL   Hemoglobin 13.9 11.1 - 15.9 g/dL   Hematocrit 84.6 96.2 - 46.6 %   MCV 89 79 - 97 fL   MCH 29.1 26.6 - 33.0 pg   MCHC 32.6 31.5 - 35.7 g/dL   RDW 95.2 84.1 - 32.4 %   Platelets 245 150 - 450 x10E3/uL   Neutrophils 49 Not Estab. %   Lymphs 42 Not Estab. %   Monocytes 8 Not Estab. %   Eos 1 Not Estab. %   Basos 0 Not Estab. %   Neutrophils Absolute 2.9 1.4 - 7.0 x10E3/uL   Lymphocytes Absolute 2.5 0.7 - 3.1 x10E3/uL   Monocytes Absolute 0.5 0.1 - 0.9 x10E3/uL   EOS (ABSOLUTE) 0.1 0.0 - 0.4 x10E3/uL   Basophils Absolute 0.0 0.0 - 0.2 x10E3/uL   Immature Granulocytes 0 Not Estab. %   Immature Grans (Abs) 0.0 0.0 - 0.1  x10E3/uL  Thyroid Panel With TSH  Result Value Ref Range   TSH 1.180 0.450 - 4.500 uIU/mL   T4, Total 8.5 4.5 - 12.0 ug/dL   T3 Uptake Ratio 29 24 - 39 %   Free Thyroxine Index 2.5 1.2 - 4.9       Pertinent labs & imaging results that were available during my care of the patient were reviewed by me and considered in my medical decision making.  Assessment & Plan:  Latasha "Florentina Addison" was seen today for left calf pain .  Diagnoses and all orders for this visit:  Pain of left calf -     [place] LT LOWER EXTREM LTD SOFT TISSUE NON VASCULAR; Future  Assessment and Plan    Left Lower Extremity Pain Acute onset with a "pop" sensation while standing, with pain along the Achilles tendon. Positive Homan's sign. No recent injuries, long travel, new medications, or surgeries. No systemic symptoms like shortness of breath, chest pain, or palpitations. Partial relief with 800mg  ibuprofen. -Order left lower extremity ultrasound to evaluate for deep vein thrombosis (DVT) and Achilles tendon pathology. -Continue ibuprofen 800mg  as needed for pain.       Continue all other maintenance medications.  Follow up plan: Return if symptoms worsen or fail to improve.   Continue healthy lifestyle choices, including diet (rich in fruits, vegetables, and lean proteins, and low in salt and simple carbohydrates) and exercise (at least 30 minutes of moderate physical activity daily).   The above assessment and management plan was discussed with  the patient. The patient verbalized understanding of and has agreed to the management plan. Patient is aware to call the clinic if they develop any new symptoms or if symptoms persist or worsen. Patient is aware when to return to the clinic for a follow-up visit. Patient educated on when it is appropriate to go to the emergency department.   Kari Baars, FNP-C [facility] [phone]

## 2023-01-11 NOTE — Addendum Note (Signed)
Addended by: Sonny Masters on: 01/11/2023 04:38 PM   Modules accepted: Orders

## 2023-01-11 NOTE — Telephone Encounter (Signed)
Pt called and made aware

## 2023-01-11 NOTE — Progress Notes (Signed)
No acute findings on imaging per radiologist. Will burst with steroids for antiinflammatory properties. Pt called and made aware of plan.

## 2023-01-11 NOTE — Telephone Encounter (Signed)
[facility] Radiology Dept called to give [place] Result.  Does not have an Achilles Tear.

## 2023-01-24 ENCOUNTER — Encounter: Payer: Self-pay | Admitting: Family Medicine

## 2023-01-25 ENCOUNTER — Other Ambulatory Visit: Payer: Self-pay | Admitting: Family Medicine

## 2023-01-25 DIAGNOSIS — M2550 Pain in unspecified joint: Secondary | ICD-10-CM

## 2023-01-25 MED ORDER — PREDNISONE 20 MG PO TABS
40.0000 mg | ORAL_TABLET | Freq: Every day | ORAL | 0 refills | Status: AC
Start: 2023-01-25 — End: 2023-01-30

## 2023-01-30 ENCOUNTER — Other Ambulatory Visit: Payer: Self-pay | Admitting: *Deleted

## 2023-01-30 DIAGNOSIS — M62830 Muscle spasm of back: Secondary | ICD-10-CM

## 2023-01-30 MED ORDER — CYCLOBENZAPRINE HCL 10 MG PO TABS: 10.0000 mg | ORAL_TABLET | Freq: Three times a day (TID) | ORAL | 0 refills | Status: DC | PRN

## 2023-02-22 ENCOUNTER — Telehealth: Payer: BC Managed Care – PPO | Admitting: Family Medicine

## 2023-02-22 DIAGNOSIS — J019 Acute sinusitis, unspecified: Secondary | ICD-10-CM

## 2023-02-22 DIAGNOSIS — B9689 Other specified bacterial agents as the cause of diseases classified elsewhere: Secondary | ICD-10-CM | POA: Diagnosis not present

## 2023-02-22 MED ORDER — DOXYCYCLINE HYCLATE 100 MG PO TABS: 100.0000 mg | ORAL_TABLET | Freq: Two times a day (BID) | ORAL | 0 refills | Status: AC

## 2023-02-22 MED ORDER — PROMETHAZINE-DM 6.25-15 MG/5ML PO SYRP: 5.0000 mL | ORAL_SOLUTION | Freq: Four times a day (QID) | ORAL | 0 refills | Status: AC | PRN

## 2023-02-22 NOTE — Progress Notes (Signed)
E-Visit for Sinus Problems  We are sorry that you are not feeling well.  Here is how we plan to help!  Based on what you have shared with me it looks like you have sinusitis.  Sinusitis is inflammation and infection in the sinus cavities of the head.  Based on your presentation I believe you most likely have Acute Bacterial Sinusitis.  This is an infection caused by bacteria and is treated with antibiotics. I have prescribed Doxycycline 100mg by mouth twice a day for 10 days. You may use an oral decongestant such as Mucinex D or if you have glaucoma or high blood pressure use plain Mucinex. Saline nasal spray help and can safely be used as often as needed for congestion.  If you develop worsening sinus pain, fever or notice severe headache and vision changes, or if symptoms are not better after completion of antibiotic, please schedule an appointment with a health care provider.    Sinus infections are not as easily transmitted as other respiratory infection, however we still recommend that you avoid close contact with loved ones, especially the very young and elderly.  Remember to wash your hands thoroughly throughout the day as this is the number one way to prevent the spread of infection!  Home Care: Only take medications as instructed by your medical team. Complete the entire course of an antibiotic. Do not take these medications with alcohol. A steam or ultrasonic humidifier can help congestion.  You can place a towel over your head and breathe in the steam from hot water coming from a faucet. Avoid close contacts especially the very young and the elderly. Cover your mouth when you cough or sneeze. Always remember to wash your hands.  Get Help Right Away If: You develop worsening fever or sinus pain. You develop a severe head ache or visual changes. Your symptoms persist after you have completed your treatment plan.  Make sure you Understand these instructions. Will watch your  condition. Will get help right away if you are not doing well or get worse.  Thank you for choosing an e-visit.  Your e-visit answers were reviewed by a board certified advanced clinical practitioner to complete your personal care plan. Depending upon the condition, your plan could have included both over the counter or prescription medications.  Please review your pharmacy choice. Make sure the pharmacy is open so you can pick up prescription now. If there is a problem, you may contact your provider through MyChart messaging and have the prescription routed to another pharmacy.  Your safety is important to us. If you have drug allergies check your prescription carefully.   For the next 24 hours you can use MyChart to ask questions about today's visit, request a non-urgent call back, or ask for a work or school excuse. You will get an email in the next two days asking about your experience. I hope that your e-visit has been valuable and will speed your recovery.    have provided 5 minutes of non face to face time during this encounter for chart review and documentation.   

## 2023-02-26 ENCOUNTER — Other Ambulatory Visit: Payer: Self-pay | Admitting: Family Medicine

## 2023-02-26 DIAGNOSIS — F411 Generalized anxiety disorder: Secondary | ICD-10-CM

## 2023-03-22 ENCOUNTER — Encounter: Payer: Self-pay | Admitting: Family Medicine

## 2023-03-28 ENCOUNTER — Other Ambulatory Visit: Payer: Self-pay | Admitting: Family Medicine

## 2023-03-28 DIAGNOSIS — G479 Sleep disorder, unspecified: Secondary | ICD-10-CM

## 2023-03-28 MED ORDER — HYDROXYZINE PAMOATE 25 MG PO CAPS
25.0000 mg | ORAL_CAPSULE | Freq: Three times a day (TID) | ORAL | 0 refills | Status: AC | PRN
Start: 2023-03-28 — End: ?

## 2023-04-22 ENCOUNTER — Other Ambulatory Visit: Payer: Self-pay | Admitting: Family Medicine

## 2023-04-22 DIAGNOSIS — G479 Sleep disorder, unspecified: Secondary | ICD-10-CM

## 2023-04-22 DIAGNOSIS — M62830 Muscle spasm of back: Secondary | ICD-10-CM

## 2023-05-02 ENCOUNTER — Telehealth: Payer: Self-pay

## 2023-05-02 NOTE — Telephone Encounter (Signed)
Pharmacy Patient Advocate Encounter   Received notification from Fax that prior authorization for Mounjaro 15MG /0.5ML auto-injectors is required/requested.   Insurance verification completed.   The patient is insured through [facility] .   Per test claim: PA required; PA submitted to above mentioned insurance via CoverMyMeds Key/confirmation #/EOC [id number] Status is pending

## 2023-05-03 NOTE — Telephone Encounter (Signed)
Pharmacy Patient Advocate Encounter  Received notification from [facility] that Prior Authorization for [facility] 15MG /0.5ML auto-injectors  has been CANCELLED due to: Weight loss treatments, including but not limited to medications, diet supplements, and surgeries outside the scope of the Centers of Excellence program, are not covered.   PA #/Case ID/Reference #: [id number]

## 2023-06-13 ENCOUNTER — Encounter: Payer: Self-pay | Admitting: Family Medicine

## 2023-06-13 ENCOUNTER — Ambulatory Visit: Payer: BC Managed Care – PPO | Admitting: Family Medicine

## 2023-06-13 VITALS — BP 122/75 | HR 76 | Temp 97.6°F | Ht 73.0 in | Wt 242.4 lb

## 2023-06-13 DIAGNOSIS — M62830 Muscle spasm of back: Secondary | ICD-10-CM

## 2023-06-13 DIAGNOSIS — Z6838 Body mass index (BMI) 38.0-38.9, adult: Secondary | ICD-10-CM | POA: Diagnosis not present

## 2023-06-13 DIAGNOSIS — K219 Gastro-esophageal reflux disease without esophagitis: Secondary | ICD-10-CM | POA: Diagnosis not present

## 2023-06-13 DIAGNOSIS — H60313 Diffuse otitis externa, bilateral: Secondary | ICD-10-CM | POA: Diagnosis not present

## 2023-06-13 DIAGNOSIS — G479 Sleep disorder, unspecified: Secondary | ICD-10-CM | POA: Diagnosis not present

## 2023-06-13 DIAGNOSIS — F411 Generalized anxiety disorder: Secondary | ICD-10-CM | POA: Diagnosis not present

## 2023-06-13 DIAGNOSIS — Z8639 Personal history of other endocrine, nutritional and metabolic disease: Secondary | ICD-10-CM

## 2023-06-13 LAB — LIPID PANEL

## 2023-06-13 MED ORDER — CIPROFLOXACIN-DEXAMETHASONE 0.3-0.1 % OT SUSP: 4.0000 [drp] | Freq: Two times a day (BID) | OTIC | 0 refills | Status: AC

## 2023-06-13 MED ORDER — CONTRAVE 8-90 MG PO TB12: ORAL_TABLET | ORAL | 0 refills | Status: AC

## 2023-06-13 MED ORDER — BUSPIRONE HCL 15 MG PO TABS: 15.0000 mg | ORAL_TABLET | Freq: Two times a day (BID) | ORAL | 3 refills | Status: DC

## 2023-06-13 MED ORDER — CONTRAVE 8-90 MG PO TB12: 2.0000 | ORAL_TABLET | Freq: Two times a day (BID) | ORAL | 2 refills | Status: AC

## 2023-06-13 MED ORDER — CYCLOBENZAPRINE HCL 10 MG PO TABS: 10.0000 mg | ORAL_TABLET | Freq: Three times a day (TID) | ORAL | 5 refills | Status: DC | PRN

## 2023-06-13 NOTE — Progress Notes (Signed)
 Subjective:  Patient ID: Latasha Holmes, female    DOB: [birth year], 43 y.o.   MRN: [id number]  Patient Care Team: Sonny Masters, FNP as PCP - General (Family Medicine) Clark-Burning, Victorino Dike, PA-C (Inactive) (Dermatology) Janalyn Harder, MD (Inactive) as Consulting Physician (Dermatology)   Chief Complaint:  BMI (6 month follow up )   HPI: Latasha Holmes is a 43 y.o. female presenting on 06/13/2023 for BMI (6 month follow up )   Discussed the use of AI scribe software for clinical note transcription with the patient, who gave verbal consent to proceed.  History of Present Illness   The patient presents with weight management concerns and medication review.  She has been off Mounjaro (tirzepatide) for a few months due to cost concerns, resulting in a plateau in her weight loss. She is considering alternative weight loss medications, including a topiramate and phentermine combination or Contrave. She wants to lose an additional thirty pounds and notes that while her weight has not changed, her clothes fit differently, suggesting body recomposition. She is currently working out regularly, about three to four times a week.  She takes Buspar, sometimes needing two or three at a time, and is considering a dosage adjustment to 15 mg. She has experienced vivid dreams with hydroxyzine and has not retried it. She uses Flexeril occasionally, about once a week, and has some remaining at home. She continues to take vitamin D and has started a hair vitamin, Viviscal, which she feels has reduced hair shedding.  She mentions ongoing issues with her ears, describing it as 'crusty' and itchy, and notes that previous treatments have not been very effective. She has not tried fungal drops for her ears but is open to considering them.         12/14/2022    8:45 AM 06/14/2022    8:36 AM 03/14/2022   12:02 PM 12/29/2021    9:09 AM 11/16/2021    8:21 AM  Depression screen PHQ 2/9  Decreased  Interest 0 1 0 0 0  Down, Depressed, Hopeless 1 1 0 0 0  PHQ - 2 Score 1 2 0 0 0  Altered sleeping 0 1 2 2 2   Tired, decreased energy 1 0 1 0 0  Change in appetite 0 0 0 0 0  Feeling bad or failure about yourself  1 0 0 0 0  Trouble concentrating 1 0 0 0 0  Moving slowly or fidgety/restless 0 0 0 0 0  Suicidal thoughts 0 0 0 0 0  PHQ-9 Score 4 3 3 2 2   Difficult doing work/chores Not difficult at all Not difficult at all Not difficult at all Somewhat difficult Somewhat difficult      12/14/2022    8:45 AM 06/14/2022    8:36 AM 03/14/2022   12:02 PM 12/29/2021    9:09 AM  GAD 7 : Generalized Anxiety Score  Nervous, Anxious, on Edge 1 0 1 1  Control/stop worrying 0 1 1 0  Worry too much - different things 1 1 1 1   Trouble relaxing 0 1 1 1   Restless 0 0 0 0  Easily annoyed or irritable 1 0 0 1  Afraid - awful might happen 1 0 0 0  Total GAD 7 Score 4 3 4 4   Anxiety Difficulty Not difficult at all Not difficult at all Somewhat difficult Somewhat difficult        Relevant past medical, surgical, family, and social history reviewed and updated  as indicated.  Allergies and medications reviewed and updated. Data reviewed: Chart in Epic.   Past Medical History:  Diagnosis Date   Atypical mole 10/08/2019   Right Foot-Anterior (moderate to severe)   Atypical mole 10/08/2019   Right Lower Back (moderate)   Varicose veins     Past Surgical History:  Procedure Laterality Date   ENDOVENOUS ABLATION SAPHENOUS VEIN W/ LASER Right 07-30-2013   endovenous laser ablation right greater saphenous vein and stab phlebectomy 10-20 incisions right leg by Gretta Began MD   ENDOVENOUS ABLATION SAPHENOUS VEIN W/ LASER Left 08-13-2013   EVLA  anterior branch of left greater saphenous vein and stab phlebectomy <10 incisions left leg by Gretta Began MD    Social History   Socioeconomic History   Marital status: Single    Spouse name: Not on file   Number of children: Not on file   Years of  education: Not on file   Highest education level: Not on file  Occupational History   Not on file  Tobacco Use   Smoking status: Never   Smokeless tobacco: Never  Vaping Use   Vaping status: Never Used  Substance and Sexual Activity   Alcohol use: Yes    Alcohol/week: 2.0 standard drinks of alcohol    Types: 2 Glasses of wine per week   Drug use: No   Sexual activity: Not Currently  Other Topics Concern   Not on file  Social History Narrative   Not on file   Social Drivers of Health   Financial Resource Strain: Not on file  Food Insecurity: Not on file  Transportation Needs: Not on file  Physical Activity: Not on file  Stress: Not on file  Social Connections: Not on file  Intimate Partner Violence: Not on file    Outpatient Encounter Medications as of 06/13/2023  Medication Sig   busPIRone (BUSPAR) 15 MG tablet Take 1 tablet (15 mg total) by mouth 2 (two) times daily.   Cholecalciferol (VITAMIN D3) 50 MCG (2000 UT) TABS TAKE 1 CAPSULE BY MOUTH ONCE DAILY   ciprofloxacin-dexamethasone (CIPRODEX) OTIC suspension Place 4 drops into both ears 2 (two) times daily for 10 days.   Eszopiclone 3 MG TABS Take 1 tablet (3 mg total) by mouth at bedtime. Take immediately before bedtime   famotidine (PEPCID) 20 MG tablet Take 1 tablet (20 mg total) by mouth 2 (two) times daily.   hydrOXYzine (VISTARIL) 25 MG capsule Take 1-2 capsules (25-50 mg total) by mouth every 8 (eight) hours as needed for anxiety (insomnia).   ibuprofen (ADVIL) 800 MG tablet Take by mouth.   levonorgestrel (MIRENA, 52 MG,) 20 MCG/DAY IUD 1 each by Intrauterine route once.   Naltrexone-buPROPion HCl ER (CONTRAVE) 8-90 MG TB12 1 tablet Po Qam x7d ; then 1 tab po BID x 7 d; then 2 tab in AM and 1 in PM X7d; Then 2 po BID from then on   Naltrexone-buPROPion HCl ER (CONTRAVE) 8-90 MG TB12 Take 2 tablets by mouth 2 (two) times daily.   ondansetron (ZOFRAN) 4 MG tablet Take 1 tablet (4 mg total) by mouth every 8 (eight)  hours as needed for nausea or vomiting.   traZODone (DESYREL) 100 MG tablet TAKE 2 TABLETS BY MOUTH AT BEDTIME   [DISCONTINUED] busPIRone (BUSPAR) 7.5 MG tablet TAKE 1 TABLET BY MOUTH THREE TIMES DAILY   [DISCONTINUED] cyclobenzaprine (FLEXERIL) 10 MG tablet Take 1 tablet by mouth three times daily as needed for muscle spasm   cyclobenzaprine (  FLEXERIL) 10 MG tablet Take 1 tablet (10 mg total) by mouth 3 (three) times daily as needed. for muscle spams   [DISCONTINUED] ciprofloxacin-dexamethasone (CIPRODEX) OTIC suspension Place 4 drops into both ears 2 (two) times daily.   [DISCONTINUED] Melatonin 10 MG TABS Take by mouth.   [DISCONTINUED] MOUNJARO 15 MG/0.5ML Pen Inject 15 mg into the skin once a week.   No facility-administered encounter medications on file as of 06/13/2023.    No Known Allergies  Pertinent ROS per HPI, otherwise unremarkable      Objective:  BP 122/75   Pulse 76   Temp 97.6 F (36.4 C)   Ht 6\' 1"  (1.854 m)   Wt 242 lb 6.4 oz (110 kg)   SpO2 96%   BMI 31.98 kg/m    Wt Readings from Last 3 Encounters:  06/13/23 242 lb 6.4 oz (110 kg)  01/11/23 242 lb 6.4 oz (110 kg)  12/14/22 244 lb 3.2 oz (110.8 kg)    Physical Exam Vitals and nursing note reviewed.  Constitutional:      General: She is not in acute distress.    Appearance: Normal appearance. She is obese. She is not ill-appearing, toxic-appearing or diaphoretic.  HENT:     Head: Normocephalic and atraumatic.     Ears:     Comments: Crusting and erythema of bilateral ear canals    Mouth/Throat:     Mouth: Mucous membranes are moist.  Eyes:     Conjunctiva/sclera: Conjunctivae normal.     Pupils: Pupils are equal, round, and reactive to light.  Cardiovascular:     Rate and Rhythm: Normal rate and regular rhythm.     Heart sounds: Normal heart sounds.  Pulmonary:     Effort: Pulmonary effort is normal.     Breath sounds: Normal breath sounds.  Musculoskeletal:     Cervical back: Neck supple.      Right lower leg: No edema.     Left lower leg: No edema.  Skin:    General: Skin is warm and dry.     Capillary Refill: Capillary refill takes less than 2 seconds.  Neurological:     General: No focal deficit present.     Mental Status: She is alert and oriented to person, place, and time.  Psychiatric:        Mood and Affect: Mood normal.        Behavior: Behavior normal. Behavior is cooperative.        Thought Content: Thought content normal.        Judgment: Judgment normal.      Results for orders placed or performed in visit on 12/14/22  CMP14+EGFR   Collection Time: 12/14/22  9:09 AM  Result Value Ref Range   Glucose 78 70 - 99 mg/dL   BUN 9 6 - 24 mg/dL   Creatinine, Ser 1.61 0.57 - 1.00 mg/dL   eGFR 82 >09 UE/AVW/0.98   BUN/Creatinine Ratio 10 9 - 23   Sodium 139 134 - 144 mmol/L   Potassium 4.6 3.5 - 5.2 mmol/L   Chloride 103 96 - 106 mmol/L   CO2 19 (L) 20 - 29 mmol/L   Calcium 9.5 8.7 - 10.2 mg/dL   Total Protein 6.5 6.0 - 8.5 g/dL   Albumin 4.4 3.9 - 4.9 g/dL   Globulin, Total 2.1 1.5 - 4.5 g/dL   Bilirubin Total 0.5 0.0 - 1.2 mg/dL   Alkaline Phosphatase 61 44 - 121 IU/L   AST 18 0 -  40 IU/L   ALT 25 0 - 32 IU/L  CBC with Differential/Platelet   Collection Time: 12/14/22  9:09 AM  Result Value Ref Range   WBC 5.9 3.4 - 10.8 x10E3/uL   RBC 4.78 3.77 - 5.28 x10E6/uL   Hemoglobin 13.9 11.1 - 15.9 g/dL   Hematocrit 96.2 95.2 - 46.6 %   MCV 89 79 - 97 fL   MCH 29.1 26.6 - 33.0 pg   MCHC 32.6 31.5 - 35.7 g/dL   RDW 84.1 32.4 - 40.1 %   Platelets 245 150 - 450 x10E3/uL   Neutrophils 49 Not Estab. %   Lymphs 42 Not Estab. %   Monocytes 8 Not Estab. %   Eos 1 Not Estab. %   Basos 0 Not Estab. %   Neutrophils Absolute 2.9 1.4 - 7.0 x10E3/uL   Lymphocytes Absolute 2.5 0.7 - 3.1 x10E3/uL   Monocytes Absolute 0.5 0.1 - 0.9 x10E3/uL   EOS (ABSOLUTE) 0.1 0.0 - 0.4 x10E3/uL   Basophils Absolute 0.0 0.0 - 0.2 x10E3/uL   Immature Granulocytes 0 Not Estab. %    Immature Grans (Abs) 0.0 0.0 - 0.1 x10E3/uL  Thyroid Panel With TSH   Collection Time: 12/14/22  9:09 AM  Result Value Ref Range   TSH 1.180 0.450 - 4.500 uIU/mL   T4, Total 8.5 4.5 - 12.0 ug/dL   T3 Uptake Ratio 29 24 - 39 %   Free Thyroxine Index 2.5 1.2 - 4.9       Pertinent labs & imaging results that were available during my care of the patient were reviewed by me and considered in my medical decision making.  Assessment & Plan:  Sophiah "Florentina Addison" was seen today for bmi.  Diagnoses and all orders for this visit:  BMI 38.0-38.9,adult -     Naltrexone-buPROPion HCl ER (CONTRAVE) 8-90 MG TB12; 1 tablet Po Qam x7d ; then 1 tab po BID x 7 d; then 2 tab in AM and 1 in PM X7d; Then 2 po BID from then on -     Naltrexone-buPROPion HCl ER (CONTRAVE) 8-90 MG TB12; Take 2 tablets by mouth 2 (two) times daily. -     CMP14+EGFR -     CBC with Differential/Platelet -     Lipid panel -     Thyroid Panel With TSH -     VITAMIN D 25 Hydroxy (Vit-D Deficiency, Fractures)  Sleep disturbance -     CMP14+EGFR -     Thyroid Panel With TSH  Gastroesophageal reflux disease without esophagitis -     CBC with Differential/Platelet  History of vitamin D deficiency -     VITAMIN D 25 Hydroxy (Vit-D Deficiency, Fractures)  GAD (generalized anxiety disorder) -     busPIRone (BUSPAR) 15 MG tablet; Take 1 tablet (15 mg total) by mouth 2 (two) times daily. -     CMP14+EGFR -     CBC with Differential/Platelet -     Thyroid Panel With TSH  Muscle spasm of back -     cyclobenzaprine (FLEXERIL) 10 MG tablet; Take 1 tablet (10 mg total) by mouth 3 (three) times daily as needed. for muscle spams -     CMP14+EGFR  Acute diffuse otitis externa of both ears -     ciprofloxacin-dexamethasone (CIPRODEX) OTIC suspension; Place 4 drops into both ears 2 (two) times daily for 10 days.     Assessment and Plan    Obesity   She has plateaued in her weight loss  after discontinuing Mounjaro (tirzepatide) due  to cost. Alternative weight loss medications were discussed, including topiramate and phentermine. However, due to the limitation of phentermine use to three months, Contrave (naltrexone/bupropion) was recommended. Contrave targets the craving center and may also help with anxiety. She is interested in trying Contrave and is aware of the need for insurance coverage or using GoodRx if necessary. The compounding pharmacy option is no longer viable due to recent legal changes.   - Prescribe Contrave with a starter pack and continual pack. Titrate dose as follows: one tablet every morning for 7 days, then one tablet twice a day, then two tablets in the morning and one in the evening, and finally two tablets twice a day.   - Order EKG to ensure no cardiac contraindications for phentermine use if needed in the future.   - Check labs including renal function and thyroid function.    Anxiety   She experiences anxiety and is currently taking Buspar. She reports needing to take two or three Buspar at a time for effectiveness. The Buspar dosage will be adjusted to 15 mg tablets, allowing her to take two if needed. The potential benefit of Contrave in managing anxiety was also discussed.   - Prescribe Buspar 15 mg tablets with instructions to take two if needed.    Sleep disturbances   She reports vivid dreams with hydroxyzine use, leading to discontinuation. She is alternating other medications for sleep, including Buspar, Remnesta, trazodone, and Flexeril. The vivid dreams are acknowledged and her current regimen is supported.   - Continue current sleep regimen with Buspar, Remnesta, trazodone, and Flexeril as needed.   - Consider retrying hydroxyzine if desired.    Hair loss   She is experiencing hair shedding but has noticed improvement with the use of Viviscal. Using head and shoulders shampoo once or twice a week is recommended to help with the shedding cycle.   - Continue Viviscal for hair shedding.   -  Use head and shoulders shampoo once or twice a week.    Ear discomfort   She reports crusty and itchy ears. Previous treatments with acetic acid were not very effective. Ciprodex drops are suggested as they are typically effective and covered by insurance.   - Prescribe Ciprodex drops for ear discomfort.          Continue all other maintenance medications.  Follow up plan: Return in about 3 months (around 09/13/2023) for BMI.   Continue healthy lifestyle choices, including diet (rich in fruits, vegetables, and lean proteins, and low in salt and simple carbohydrates) and exercise (at least 30 minutes of moderate physical activity daily).  Educational handout given for Contrave  The above assessment and management plan was discussed with the patient. The patient verbalized understanding of and has agreed to the management plan. Patient is aware to call the clinic if they develop any new symptoms or if symptoms persist or worsen. Patient is aware when to return to the clinic for a follow-up visit. Patient educated on when it is appropriate to go to the emergency department.   Kari Baars, FNP-C [facility] [phone]

## 2023-06-14 ENCOUNTER — Encounter: Payer: Self-pay | Admitting: Family Medicine

## 2023-06-14 LAB — CMP14+EGFR
ALT: 20 IU/L (ref 0–32)
AST: 18 IU/L (ref 0–40)
Albumin: 4.4 g/dL (ref 3.9–4.9)
Alkaline Phosphatase: 52 IU/L (ref 44–121)
BUN/Creatinine Ratio: 14 (ref 9–23)
BUN: 11 mg/dL (ref 6–24)
Bilirubin Total: 0.5 mg/dL (ref 0.0–1.2)
CO2: 19 mmol/L — ABNORMAL LOW (ref 20–29)
Calcium: 9.6 mg/dL (ref 8.7–10.2)
Chloride: 105 mmol/L (ref 96–106)
Creatinine, Ser: 0.81 mg/dL (ref 0.57–1.00)
Globulin, Total: 2 g/dL (ref 1.5–4.5)
Glucose: 81 mg/dL (ref 70–99)
Potassium: 4.4 mmol/L (ref 3.5–5.2)
Sodium: 138 mmol/L (ref 134–144)
Total Protein: 6.4 g/dL (ref 6.0–8.5)
eGFR: 93 mL/min/{1.73_m2} (ref >59)

## 2023-06-14 LAB — CBC WITH DIFFERENTIAL/PLATELET
Basophils Absolute: 0 10*3/uL (ref 0.0–0.2)
Basos: 1 %
EOS (ABSOLUTE): 0 10*3/uL (ref 0.0–0.4)
Eos: 1 %
Hematocrit: 42.1 % (ref 34.0–46.6)
Hemoglobin: 13.6 g/dL (ref 11.1–15.9)
Immature Grans (Abs): 0 10*3/uL (ref 0.0–0.1)
Immature Granulocytes: 0 %
Lymphocytes Absolute: 1.9 10*3/uL (ref 0.7–3.1)
Lymphs: 37 %
MCH: 28.6 pg (ref 26.6–33.0)
MCHC: 32.3 g/dL (ref 31.5–35.7)
MCV: 88 fL (ref 79–97)
Monocytes Absolute: 0.4 10*3/uL (ref 0.1–0.9)
Monocytes: 7 %
Neutrophils Absolute: 2.8 10*3/uL (ref 1.4–7.0)
Neutrophils: 54 %
Platelets: 244 10*3/uL (ref 150–450)
RBC: 4.76 x10E6/uL (ref 3.77–5.28)
RDW: 11.8 % (ref 11.7–15.4)
WBC: 5.2 10*3/uL (ref 3.4–10.8)

## 2023-06-14 LAB — THYROID PANEL WITH TSH
Free Thyroxine Index: 2.4 (ref 1.2–4.9)
T3 Uptake Ratio: 30 % (ref 24–39)
T4, Total: 7.9 ug/dL (ref 4.5–12.0)
TSH: 1.34 u[IU]/mL (ref 0.450–4.500)

## 2023-06-14 LAB — LIPID PANEL
Cholesterol, Total: 139 mg/dL (ref 100–199)
HDL: 63 mg/dL (ref >39)
LDL CALC COMMENT:: 2.2 ratio (ref 0.0–4.4)
LDL Chol Calc (NIH): 64 mg/dL (ref 0–99)
Triglycerides: 59 mg/dL (ref 0–149)
VLDL Cholesterol Cal: 12 mg/dL (ref 5–40)

## 2023-06-14 LAB — VITAMIN D 25 HYDROXY (VIT D DEFICIENCY, FRACTURES): Vit D, 25-Hydroxy: 37.2 ng/mL (ref 30.0–100.0)

## 2023-06-17 ENCOUNTER — Other Ambulatory Visit: Payer: Self-pay | Admitting: Family Medicine

## 2023-06-17 DIAGNOSIS — G479 Sleep disorder, unspecified: Secondary | ICD-10-CM

## 2023-06-17 DIAGNOSIS — Z79899 Other long term (current) drug therapy: Secondary | ICD-10-CM

## 2023-06-17 NOTE — Telephone Encounter (Signed)
 Last OV 06/13/2023.-didn't know if you meant to refill these at that appt or if you were discontinuing

## 2023-07-09 ENCOUNTER — Encounter: Payer: Self-pay | Admitting: Family Medicine

## 2023-07-09 ENCOUNTER — Other Ambulatory Visit: Payer: Self-pay | Admitting: Family Medicine

## 2023-07-09 DIAGNOSIS — Z87898 Personal history of other specified conditions: Secondary | ICD-10-CM

## 2023-07-09 MED ORDER — MECLIZINE HCL 25 MG PO TABS
25.0000 mg | ORAL_TABLET | Freq: Three times a day (TID) | ORAL | 0 refills | Status: DC | PRN
Start: 2023-07-09 — End: 2023-10-10

## 2023-07-09 MED ORDER — ONDANSETRON HCL 4 MG PO TABS: 4.0000 mg | ORAL_TABLET | Freq: Three times a day (TID) | ORAL | 0 refills | Status: AC | PRN

## 2023-08-09 ENCOUNTER — Telehealth: Admitting: Physician Assistant

## 2023-08-09 DIAGNOSIS — R051 Acute cough: Secondary | ICD-10-CM | POA: Diagnosis not present

## 2023-08-09 DIAGNOSIS — B9689 Other specified bacterial agents as the cause of diseases classified elsewhere: Secondary | ICD-10-CM

## 2023-08-09 DIAGNOSIS — J019 Acute sinusitis, unspecified: Secondary | ICD-10-CM

## 2023-08-09 MED ORDER — PROMETHAZINE-DM 6.25-15 MG/5ML PO SYRP: 5.0000 mL | ORAL_SOLUTION | Freq: Four times a day (QID) | ORAL | 0 refills | Status: DC | PRN

## 2023-08-09 MED ORDER — AMOXICILLIN-POT CLAVULANATE 875-125 MG PO TABS
1.0000 | ORAL_TABLET | Freq: Two times a day (BID) | ORAL | 0 refills | Status: DC
Start: 2023-08-09 — End: 2023-10-10

## 2023-08-09 NOTE — Progress Notes (Signed)

## 2023-08-09 NOTE — Addendum Note (Signed)
 Addended by: Angelia Kelp on: 08/09/2023 03:43 PM   Modules accepted: Orders

## 2023-08-14 ENCOUNTER — Encounter: Payer: Self-pay | Admitting: Family Medicine

## 2023-08-14 ENCOUNTER — Telehealth (INDEPENDENT_AMBULATORY_CARE_PROVIDER_SITE_OTHER)

## 2023-08-14 ENCOUNTER — Ambulatory Visit: Admitting: Nurse Practitioner

## 2023-08-14 DIAGNOSIS — J40 Bronchitis, not specified as acute or chronic: Secondary | ICD-10-CM | POA: Diagnosis not present

## 2023-08-14 MED ORDER — BENZONATATE 200 MG PO CAPS: 200.0000 mg | ORAL_CAPSULE | Freq: Two times a day (BID) | ORAL | 1 refills | Status: DC | PRN

## 2023-08-14 MED ORDER — ALBUTEROL SULFATE HFA 108 (90 BASE) MCG/ACT IN AERS: 2.0000 | INHALATION_SPRAY | Freq: Four times a day (QID) | RESPIRATORY_TRACT | 0 refills | Status: AC | PRN

## 2023-08-14 NOTE — Progress Notes (Signed)
 Virtual Visit via MyChart video note  I connected with Latasha Holmes on 08/14/23 at 1631 by video and verified that I am speaking with the correct person using two identifiers. Latasha Holmes is currently located at work and patient are currently with her during visit. The provider, Lucio Sabin Adnan Vanvoorhis, MD is located in their office at time of visit.  Call ended at 1642  I discussed the limitations, risks, security and privacy concerns of performing an evaluation and management service by video and the availability of in person appointments. I also discussed with the patient that there may be a patient responsible charge related to this service. The patient expressed understanding and agreed to proceed.   History and Present Illness: Patient is calling in for sinus infection and has been on augmentin  and promethazine  dm.  She has some wheezing and a deep cough.  She denies fevers or chills.  She is day 4-5 on antibiotic. She has some history of sinus infections. Cough syrup is not helping as much.  She is wearing a mask at work.    1. Bronchitis     Outpatient Encounter Medications as of 08/14/2023  Medication Sig   albuterol  (VENTOLIN  HFA) 108 (90 Base) MCG/ACT inhaler Inhale 2 puffs into the lungs every 6 (six) hours as needed for wheezing or shortness of breath.   benzonatate  (TESSALON ) 200 MG capsule Take 1 capsule (200 mg total) by mouth 2 (two) times daily as needed for cough.   amoxicillin -clavulanate (AUGMENTIN ) 875-125 MG tablet Take 1 tablet by mouth 2 (two) times daily.   busPIRone  (BUSPAR ) 15 MG tablet Take 1 tablet (15 mg total) by mouth 2 (two) times daily.   Cholecalciferol (VITAMIN D3) 50 MCG (2000 UT) TABS TAKE 1 CAPSULE BY MOUTH ONCE DAILY   cyclobenzaprine  (FLEXERIL ) 10 MG tablet Take 1 tablet (10 mg total) by mouth 3 (three) times daily as needed. for muscle spams   Eszopiclone  3 MG TABS TAKE 1 TABLET BY MOUTH IMMEDIATELY BEFORE BEDTIME   famotidine  (PEPCID )  20 MG tablet Take 1 tablet (20 mg total) by mouth 2 (two) times daily.   hydrOXYzine  (VISTARIL ) 25 MG capsule Take 1-2 capsules (25-50 mg total) by mouth every 8 (eight) hours as needed for anxiety (insomnia).   ibuprofen (ADVIL) 800 MG tablet Take by mouth.   levonorgestrel (MIRENA, 52 MG,) 20 MCG/DAY IUD 1 each by Intrauterine route once.   meclizine  (ANTIVERT ) 25 MG tablet Take 1 tablet (25 mg total) by mouth 3 (three) times daily as needed for dizziness.   Naltrexone-buPROPion HCl ER (CONTRAVE ) 8-90 MG TB12 1 tablet Po Qam x7d ; then 1 tab po BID x 7 d; then 2 tab in AM and 1 in PM X7d; Then 2 po BID from then on   Naltrexone-buPROPion HCl ER (CONTRAVE ) 8-90 MG TB12 Take 2 tablets by mouth 2 (two) times daily.   ondansetron  (ZOFRAN ) 4 MG tablet Take 1 tablet (4 mg total) by mouth every 8 (eight) hours as needed for nausea or vomiting.   ondansetron  (ZOFRAN ) 4 MG tablet Take 1 tablet (4 mg total) by mouth every 8 (eight) hours as needed for nausea or vomiting.   promethazine -dextromethorphan (PROMETHAZINE -DM) 6.25-15 MG/5ML syrup Take 5 mLs by mouth 4 (four) times daily as needed.   traZODone  (DESYREL ) 100 MG tablet TAKE 2 TABLETS BY MOUTH AT BEDTIME   No facility-administered encounter medications on file as of 08/14/2023.    Review of Systems  Constitutional:  Negative for chills  and fever.  HENT:  Positive for congestion, postnasal drip and sinus pressure. Negative for ear discharge, ear pain, rhinorrhea, sneezing and sore throat.   Eyes:  Negative for pain, redness and visual disturbance.  Respiratory:  Positive for cough and wheezing. Negative for chest tightness and shortness of breath.   Cardiovascular:  Negative for chest pain and leg swelling.  Genitourinary:  Negative for difficulty urinating and dysuria.  Musculoskeletal:  Negative for back pain and gait problem.  Skin:  Negative for rash.  Neurological:  Negative for light-headedness and headaches.  Psychiatric/Behavioral:   Negative for agitation and behavioral problems.   All other systems reviewed and are negative.   Observations/Objective: Patient sounds comfortable, is coughing but otherwise sounds comfortable and in no acute distress.  Assessment and Plan: Problem List Items Addressed This Visit   None Visit Diagnoses       Bronchitis    -  Primary   Relevant Medications   albuterol  (VENTOLIN  HFA) 108 (90 Base) MCG/ACT inhaler   benzonatate  (TESSALON ) 200 MG capsule   Other Relevant Orders   DG Chest 2 View       Continue the Augmentin  and sent albuterol  inhaler for her and Tessalon  Perles that she can try instead of the promethazine .  If she continues to worsen I told her she can come in and do an x-ray and I did put in the order for it.  Also would consider that if she continues to worsen we could do a short course of steroids. Follow up plan: Return if symptoms worsen or fail to improve.     I discussed the assessment and treatment plan with the patient. The patient was provided an opportunity to ask questions and all were answered. The patient agreed with the plan and demonstrated an understanding of the instructions.   The patient was advised to call back or seek an in-person evaluation if the symptoms worsen or if the condition fails to improve as anticipated.  The above assessment and management plan was discussed with the patient. The patient verbalized understanding of and has agreed to the management plan. Patient is aware to call the clinic if symptoms persist or worsen. Patient is aware when to return to the clinic for a follow-up visit. Patient educated on when it is appropriate to go to the emergency department.    I provided 11 minutes of non-face-to-face time during this encounter.    Hilton Lucky, MD

## 2023-08-20 ENCOUNTER — Other Ambulatory Visit: Payer: Self-pay | Admitting: Family Medicine

## 2023-08-20 DIAGNOSIS — G479 Sleep disorder, unspecified: Secondary | ICD-10-CM

## 2023-08-20 DIAGNOSIS — Z79899 Other long term (current) drug therapy: Secondary | ICD-10-CM

## 2023-09-13 ENCOUNTER — Ambulatory Visit: Admitting: Family Medicine

## 2023-10-10 ENCOUNTER — Encounter: Payer: Self-pay | Admitting: Family Medicine

## 2023-10-10 ENCOUNTER — Ambulatory Visit (INDEPENDENT_AMBULATORY_CARE_PROVIDER_SITE_OTHER): Admitting: Family Medicine

## 2023-10-10 ENCOUNTER — Ambulatory Visit: Payer: Self-pay | Admitting: Family Medicine

## 2023-10-10 VITALS — BP 111/69 | Temp 97.7°F | Ht 73.0 in | Wt 242.4 lb

## 2023-10-10 DIAGNOSIS — N898 Other specified noninflammatory disorders of vagina: Secondary | ICD-10-CM

## 2023-10-10 DIAGNOSIS — K219 Gastro-esophageal reflux disease without esophagitis: Secondary | ICD-10-CM

## 2023-10-10 DIAGNOSIS — Z6838 Body mass index (BMI) 38.0-38.9, adult: Secondary | ICD-10-CM

## 2023-10-10 DIAGNOSIS — G479 Sleep disorder, unspecified: Secondary | ICD-10-CM

## 2023-10-10 DIAGNOSIS — F411 Generalized anxiety disorder: Secondary | ICD-10-CM

## 2023-10-10 DIAGNOSIS — Z79899 Other long term (current) drug therapy: Secondary | ICD-10-CM

## 2023-10-10 DIAGNOSIS — M62830 Muscle spasm of back: Secondary | ICD-10-CM | POA: Diagnosis not present

## 2023-10-10 LAB — WET PREP FOR TRICH, YEAST, CLUE
Trichomonas Exam: NEGATIVE
Yeast Exam: NEGATIVE

## 2023-10-10 MED ORDER — ESZOPICLONE 3 MG PO TABS: 3.0000 mg | ORAL_TABLET | Freq: Every day | ORAL | 3 refills | Status: AC

## 2023-10-10 MED ORDER — BUSPIRONE HCL 15 MG PO TABS: 15.0000 mg | ORAL_TABLET | Freq: Two times a day (BID) | ORAL | 3 refills | Status: AC

## 2023-10-10 MED ORDER — FAMOTIDINE 20 MG PO TABS: 20.0000 mg | ORAL_TABLET | Freq: Two times a day (BID) | ORAL | 1 refills | Status: AC

## 2023-10-10 MED ORDER — CYCLOBENZAPRINE HCL 10 MG PO TABS: 10.0000 mg | ORAL_TABLET | Freq: Three times a day (TID) | ORAL | 5 refills | Status: AC | PRN

## 2023-10-10 MED ORDER — TRAZODONE HCL 100 MG PO TABS
200.0000 mg | ORAL_TABLET | Freq: Every day | ORAL | 1 refills | Status: AC
Start: 2023-10-10 — End: ?

## 2023-10-10 NOTE — Progress Notes (Signed)
 Subjective:  Patient ID: Latasha Holmes, female    DOB: 24-Apr-1980, 43 y.o.   MRN: [id number]  Patient Care Team: Severa Rock CHRISTELLA, FNP as PCP - General (Family Medicine) Clark-Burning, Delon, PA-C (Inactive) (Dermatology) Livingston Rigg, MD (Inactive) as Consulting Physician (Dermatology)   Chief Complaint:  BMI (3 month follow up )   HPI: Latasha Holmes is a 43 y.o. female presenting on 10/10/2023 for BMI (3 month follow up )   The patient presents with concerns about weight management and potential yeast infection symptoms.  She is considering starting Contrave  for weight loss, aiming to lose 20 to 30 pounds. She has not yet tried the medication but plans to fill the prescription soon, noting that it is more affordable than previous weight loss medications she has used.  For sleep, she takes Buspar  at night, one or two depending on her day, and alternates between two trazodone  or trazodone  and Lunesta . She has not used hydroxyzine  recently due to experiencing 'crazy dreams' the one time she tried it. No daytime fatigue or unusual dreams with her current sleep medications.  She reports a recent increase in vaginal discharge over the past couple of days, without itching or external irritation. She has had a Mirena IUD for nearly eight years, which is due for replacement next July. She notes that she has started having periods again, coinciding with her daughter's menstrual cycle, despite having the Mirena. No swelling in her legs or feet.          Relevant past medical, surgical, family, and social history reviewed and updated as indicated.  Allergies and medications reviewed and updated. Data reviewed: Chart in Epic.   Past Medical History:  Diagnosis Date   Atypical mole 10/08/2019   Right Foot-Anterior (moderate to severe)   Atypical mole 10/08/2019   Right Lower Back (moderate)   Varicose veins     Past Surgical History:  Procedure Laterality Date    ENDOVENOUS ABLATION SAPHENOUS VEIN W/ LASER Right 07-30-2013   endovenous laser ablation right greater saphenous vein and stab phlebectomy 10-20 incisions right leg by Krystal Doing MD   ENDOVENOUS ABLATION SAPHENOUS VEIN W/ LASER Left 08-13-2013   EVLA  anterior branch of left greater saphenous vein and stab phlebectomy <10 incisions left leg by Krystal Doing MD    Social History   Socioeconomic History   Marital status: Single    Spouse name: Not on file   Number of children: Not on file   Years of education: Not on file   Highest education level: Not on file  Occupational History   Not on file  Tobacco Use   Smoking status: Never   Smokeless tobacco: Never  Vaping Use   Vaping status: Never Used  Substance and Sexual Activity   Alcohol use: Yes    Alcohol/week: 2.0 standard drinks of alcohol    Types: 2 Glasses of wine per week   Drug use: No   Sexual activity: Not Currently  Other Topics Concern   Not on file  Social History Narrative   Not on file   Social Drivers of Health   Financial Resource Strain: Not on file  Food Insecurity: Not on file  Transportation Needs: Not on file  Physical Activity: Not on file  Stress: Not on file  Social Connections: Not on file  Intimate Partner Violence: Not on file    Outpatient Encounter Medications as of 10/10/2023  Medication Sig   albuterol  (VENTOLIN  HFA) 108 (  90 Base) MCG/ACT inhaler Inhale 2 puffs into the lungs every 6 (six) hours as needed for wheezing or shortness of breath.   Cholecalciferol (VITAMIN D3) 50 MCG (2000 UT) TABS TAKE 1 CAPSULE BY MOUTH ONCE DAILY   hydrOXYzine  (VISTARIL ) 25 MG capsule Take 1-2 capsules (25-50 mg total) by mouth every 8 (eight) hours as needed for anxiety (insomnia).   ibuprofen (ADVIL) 800 MG tablet Take by mouth.   levonorgestrel (MIRENA, 52 MG,) 20 MCG/DAY IUD 1 each by Intrauterine route once.   ondansetron  (ZOFRAN ) 4 MG tablet Take 1 tablet (4 mg total) by mouth every 8 (eight) hours as  needed for nausea or vomiting.   ondansetron  (ZOFRAN ) 4 MG tablet Take 1 tablet (4 mg total) by mouth every 8 (eight) hours as needed for nausea or vomiting.   [DISCONTINUED] busPIRone  (BUSPAR ) 15 MG tablet Take 1 tablet (15 mg total) by mouth 2 (two) times daily.   [DISCONTINUED] cyclobenzaprine  (FLEXERIL ) 10 MG tablet Take 1 tablet (10 mg total) by mouth 3 (three) times daily as needed. for muscle spams   [DISCONTINUED] Eszopiclone  3 MG TABS TAKE 1 TABLET BY MOUTH IMMEDIATELY BEFORE BEDTIME   [DISCONTINUED] famotidine  (PEPCID ) 20 MG tablet Take 1 tablet (20 mg total) by mouth 2 (two) times daily.   [DISCONTINUED] traZODone  (DESYREL ) 100 MG tablet TAKE 2 TABLETS BY MOUTH AT BEDTIME   busPIRone  (BUSPAR ) 15 MG tablet Take 1 tablet (15 mg total) by mouth 2 (two) times daily.   cyclobenzaprine  (FLEXERIL ) 10 MG tablet Take 1 tablet (10 mg total) by mouth 3 (three) times daily as needed. for muscle spams   Eszopiclone  3 MG TABS Take 1 tablet (3 mg total) by mouth at bedtime. Take immediately before bedtime   famotidine  (PEPCID ) 20 MG tablet Take 1 tablet (20 mg total) by mouth 2 (two) times daily.   Naltrexone-buPROPion HCl ER (CONTRAVE ) 8-90 MG TB12 1 tablet Po Qam x7d ; then 1 tab po BID x 7 d; then 2 tab in AM and 1 in PM X7d; Then 2 po BID from then on (Patient not taking: Reported on 10/10/2023)   Naltrexone-buPROPion HCl ER (CONTRAVE ) 8-90 MG TB12 Take 2 tablets by mouth 2 (two) times daily. (Patient not taking: Reported on 10/10/2023)   traZODone  (DESYREL ) 100 MG tablet Take 2 tablets (200 mg total) by mouth at bedtime.   [DISCONTINUED] amoxicillin -clavulanate (AUGMENTIN ) 875-125 MG tablet Take 1 tablet by mouth 2 (two) times daily.   [DISCONTINUED] benzonatate  (TESSALON ) 200 MG capsule Take 1 capsule (200 mg total) by mouth 2 (two) times daily as needed for cough.   [DISCONTINUED] estradiol (ESTRACE) 1 MG tablet Take 1 mg by mouth daily.   [DISCONTINUED] meclizine  (ANTIVERT ) 25 MG tablet Take 1  tablet (25 mg total) by mouth 3 (three) times daily as needed for dizziness.   [DISCONTINUED] promethazine -dextromethorphan (PROMETHAZINE -DM) 6.25-15 MG/5ML syrup Take 5 mLs by mouth 4 (four) times daily as needed.   No facility-administered encounter medications on file as of 10/10/2023.    No Known Allergies  Pertinent ROS per HPI, otherwise unremarkable      Objective:  BP 111/69   Temp 97.7 F (36.5 C)   Ht 6' 1 (1.854 m)   Wt 242 lb 6.4 oz (110 kg)   SpO2 99%   BMI 31.98 kg/m    Wt Readings from Last 3 Encounters:  10/10/23 242 lb 6.4 oz (110 kg)  06/13/23 242 lb 6.4 oz (110 kg)  01/11/23 242 lb 6.4 oz (110 kg)  Physical Exam Vitals and nursing note reviewed.  Constitutional:      General: She is not in acute distress.    Appearance: Normal appearance. She is obese. She is not ill-appearing, toxic-appearing or diaphoretic.  HENT:     Head: Normocephalic and atraumatic.     Nose: Nose normal.     Mouth/Throat:     Mouth: Mucous membranes are moist.  Eyes:     Pupils: Pupils are equal, round, and reactive to light.  Cardiovascular:     Rate and Rhythm: Normal rate and regular rhythm.     Heart sounds: Normal heart sounds.  Pulmonary:     Effort: Pulmonary effort is normal.     Breath sounds: Normal breath sounds.  Musculoskeletal:     Cervical back: Neck supple.     Right lower leg: No edema.     Left lower leg: No edema.  Skin:    General: Skin is warm and dry.     Capillary Refill: Capillary refill takes less than 2 seconds.  Neurological:     General: No focal deficit present.     Mental Status: She is alert and oriented to person, place, and time.  Psychiatric:        Mood and Affect: Mood normal.        Behavior: Behavior normal.        Thought Content: Thought content normal.        Judgment: Judgment normal.     Results for orders placed or performed in visit on 06/13/23  CMP14+EGFR   Collection Time: 06/13/23  9:16 AM  Result Value Ref  Range   Glucose 81 70 - 99 mg/dL   BUN 11 6 - 24 mg/dL   Creatinine, Ser 9.18 0.57 - 1.00 mg/dL   eGFR 93 >40 fO/fpw/8.26   BUN/Creatinine Ratio 14 9 - 23   Sodium 138 134 - 144 mmol/L   Potassium 4.4 3.5 - 5.2 mmol/L   Chloride 105 96 - 106 mmol/L   CO2 19 (L) 20 - 29 mmol/L   Calcium 9.6 8.7 - 10.2 mg/dL   Total Protein 6.4 6.0 - 8.5 g/dL   Albumin 4.4 3.9 - 4.9 g/dL   Globulin, Total 2.0 1.5 - 4.5 g/dL   Bilirubin Total 0.5 0.0 - 1.2 mg/dL   Alkaline Phosphatase 52 44 - 121 IU/L   AST 18 0 - 40 IU/L   ALT 20 0 - 32 IU/L  CBC with Differential/Platelet   Collection Time: 06/13/23  9:16 AM  Result Value Ref Range   WBC 5.2 3.4 - 10.8 x10E3/uL   RBC 4.76 3.77 - 5.28 x10E6/uL   Hemoglobin 13.6 11.1 - 15.9 g/dL   Hematocrit 57.8 65.9 - 46.6 %   MCV 88 79 - 97 fL   MCH 28.6 26.6 - 33.0 pg   MCHC 32.3 31.5 - 35.7 g/dL   RDW 88.1 88.2 - 84.5 %   Platelets 244 150 - 450 x10E3/uL   Neutrophils 54 Not Estab. %   Lymphs 37 Not Estab. %   Monocytes 7 Not Estab. %   Eos 1 Not Estab. %   Basos 1 Not Estab. %   Neutrophils Absolute 2.8 1.4 - 7.0 x10E3/uL   Lymphocytes Absolute 1.9 0.7 - 3.1 x10E3/uL   Monocytes Absolute 0.4 0.1 - 0.9 x10E3/uL   EOS (ABSOLUTE) 0.0 0.0 - 0.4 x10E3/uL   Basophils Absolute 0.0 0.0 - 0.2 x10E3/uL   Immature Granulocytes 0 Not Estab. %  Immature Grans (Abs) 0.0 0.0 - 0.1 x10E3/uL  Lipid panel   Collection Time: 06/13/23  9:16 AM  Result Value Ref Range   Cholesterol, Total 139 100 - 199 mg/dL   Triglycerides 59 0 - 149 mg/dL   HDL 63 >60 mg/dL   VLDL Cholesterol Cal 12 5 - 40 mg/dL   LDL Chol Calc (NIH) 64 0 - 99 mg/dL   Chol/HDL Ratio 2.2 0.0 - 4.4 ratio  Thyroid  Panel With TSH   Collection Time: 06/13/23  9:16 AM  Result Value Ref Range   TSH 1.340 0.450 - 4.500 uIU/mL   T4, Total 7.9 4.5 - 12.0 ug/dL   T3 Uptake Ratio 30 24 - 39 %   Free Thyroxine Index 2.4 1.2 - 4.9  VITAMIN D  25 Hydroxy (Vit-D Deficiency, Fractures)   Collection Time:  06/13/23  9:16 AM  Result Value Ref Range   Vit D, 25-Hydroxy 37.2 30.0 - 100.0 ng/mL       Pertinent labs & imaging results that were available during my care of the patient were reviewed by me and considered in my medical decision making.  Assessment & Plan:  Latasha Holmes was seen today for bmi.  Diagnoses and all orders for this visit:  BMI 38.0-38.9,adult Start Contrave   Sleep disturbance -     traZODone  (DESYREL ) 100 MG tablet; Take 2 tablets (200 mg total) by mouth at bedtime. -     Eszopiclone  3 MG TABS; Take 1 tablet (3 mg total) by mouth at bedtime. Take immediately before bedtime  Gastroesophageal reflux disease without esophagitis -     famotidine  (PEPCID ) 20 MG tablet; Take 1 tablet (20 mg total) by mouth 2 (two) times daily.  Muscle spasm of back -     cyclobenzaprine  (FLEXERIL ) 10 MG tablet; Take 1 tablet (10 mg total) by mouth 3 (three) times daily as needed. for muscle spams  Controlled substance agreement signed -     Eszopiclone  3 MG TABS; Take 1 tablet (3 mg total) by mouth at bedtime. Take immediately before bedtime  GAD (generalized anxiety disorder) -     busPIRone  (BUSPAR ) 15 MG tablet; Take 1 tablet (15 mg total) by mouth 2 (two) times daily.  Vaginal discharge -     WET PREP FOR TRICH, YEAST, CLUE      Vaginal Discharge Increased vaginal discharge without itching or external irritation for a couple of days. She has a Mirena IUD in place for nearly eight years. Suspected bacterial vaginosis (BV) or hormonal changes due to the nearing end of the IUD's hormonal effectiveness. - Perform a wet prep to evaluate for BV and yeast infection.  Weight Management She aims to lose 20-30 pounds and has not tried Contrave , which combines bupropion and naltrexone to suppress appetite and reduce cravings. Positive outcomes have been observed with Contrave , especially when combined with diet and exercise. She is active and considers Contrave  a more affordable  option compared to GLP-1 agonists. - Initiate Contrave  as per the starter pack instructions, starting with one tablet daily and gradually increasing the dose. - Schedule follow-up in three months to assess Contrave 's effectiveness. - Provide information on Contrave  via MyChart for further understanding.  Sleep Disturbance Managing sleep disturbances with Buspar , trazodone , and occasionally Lunesta . The regimen is effective without significant side effects. Hydroxyzine  was discontinued due to vivid dreams. - Continue current sleep medication regimen with Buspar , trazodone , and Lunesta  as needed. - Refill prescriptions for Buspar  and Lunesta .  Continue all other maintenance medications.  Follow up plan: Return in about 3 months (around 01/10/2024), or if symptoms worsen or fail to improve, for BMI.   Continue healthy lifestyle choices, including diet (rich in fruits, vegetables, and lean proteins, and low in salt and simple carbohydrates) and exercise (at least 30 minutes of moderate physical activity daily).  Educational handout given for Contrave   The above assessment and management plan was discussed with the patient. The patient verbalized understanding of and has agreed to the management plan. Patient is aware to call the clinic if they develop any new symptoms or if symptoms persist or worsen. Patient is aware when to return to the clinic for a follow-up visit. Patient educated on when it is appropriate to go to the emergency department.   Rosaline Bruns, FNP-C [facility] [phone]

## 2024-01-15 ENCOUNTER — Ambulatory Visit: Admitting: Family Medicine
